# Patient Record
Sex: Female | Born: 1965 | Race: White | Hispanic: No | Marital: Single | Smoking: Former smoker
Health system: Southern US, Community
[De-identification: ages and names within clinical notes are randomized; demographics above are authoritative.]

## PROBLEM LIST (undated history)

## (undated) DIAGNOSIS — Z87898 Personal history of other specified conditions: Secondary | ICD-10-CM

## (undated) DIAGNOSIS — M1990 Unspecified osteoarthritis, unspecified site: Secondary | ICD-10-CM

## (undated) DIAGNOSIS — J45909 Unspecified asthma, uncomplicated: Secondary | ICD-10-CM

## (undated) DIAGNOSIS — I83893 Varicose veins of bilateral lower extremities with other complications: Secondary | ICD-10-CM

## (undated) DIAGNOSIS — K7460 Unspecified cirrhosis of liver: Secondary | ICD-10-CM

## (undated) DIAGNOSIS — F419 Anxiety disorder, unspecified: Secondary | ICD-10-CM

## (undated) DIAGNOSIS — F329 Major depressive disorder, single episode, unspecified: Secondary | ICD-10-CM

## (undated) DIAGNOSIS — K219 Gastro-esophageal reflux disease without esophagitis: Secondary | ICD-10-CM

## (undated) DIAGNOSIS — J449 Chronic obstructive pulmonary disease, unspecified: Secondary | ICD-10-CM

## (undated) DIAGNOSIS — F32A Depression, unspecified: Secondary | ICD-10-CM

## (undated) DIAGNOSIS — F1910 Other psychoactive substance abuse, uncomplicated: Secondary | ICD-10-CM

## (undated) DIAGNOSIS — R031 Nonspecific low blood-pressure reading: Secondary | ICD-10-CM

## (undated) DIAGNOSIS — M419 Scoliosis, unspecified: Secondary | ICD-10-CM

## (undated) HISTORY — DX: Other psychoactive substance abuse, uncomplicated: F19.10

## (undated) HISTORY — DX: Depression, unspecified: F32.A

## (undated) HISTORY — DX: Scoliosis, unspecified: M41.9

## (undated) HISTORY — DX: Major depressive disorder, single episode, unspecified: F32.9

## (undated) HISTORY — DX: Unspecified cirrhosis of liver: K74.60

## (undated) HISTORY — PX: TUBAL LIGATION: SHX77

## (undated) HISTORY — DX: Anxiety disorder, unspecified: F41.9

---

## 2013-07-05 ENCOUNTER — Encounter (HOSPITAL_COMMUNITY): Payer: Self-pay | Admitting: Emergency Medicine

## 2013-07-05 ENCOUNTER — Inpatient Hospital Stay (HOSPITAL_COMMUNITY)
Admission: EM | Admit: 2013-07-05 | Discharge: 2013-07-08 | DRG: 641 | Disposition: A | Discharge status: Home / Self Care | Payer: Self-pay | Attending: Internal Medicine | Admitting: Internal Medicine

## 2013-07-05 DIAGNOSIS — L409 Psoriasis, unspecified: Secondary | ICD-10-CM

## 2013-07-05 DIAGNOSIS — K5289 Other specified noninfective gastroenteritis and colitis: Secondary | ICD-10-CM | POA: Diagnosis present

## 2013-07-05 DIAGNOSIS — R7989 Other specified abnormal findings of blood chemistry: Secondary | ICD-10-CM

## 2013-07-05 DIAGNOSIS — R748 Abnormal levels of other serum enzymes: Secondary | ICD-10-CM

## 2013-07-05 DIAGNOSIS — A779 Spotted fever, unspecified: Secondary | ICD-10-CM | POA: Diagnosis present

## 2013-07-05 DIAGNOSIS — K529 Noninfective gastroenteritis and colitis, unspecified: Secondary | ICD-10-CM

## 2013-07-05 DIAGNOSIS — R233 Spontaneous ecchymoses: Secondary | ICD-10-CM

## 2013-07-05 DIAGNOSIS — R740 Nonspecific elevation of levels of transaminase and lactic acid dehydrogenase [LDH]: Secondary | ICD-10-CM

## 2013-07-05 DIAGNOSIS — R945 Abnormal results of liver function studies: Secondary | ICD-10-CM

## 2013-07-05 DIAGNOSIS — R7402 Elevation of levels of lactic acid dehydrogenase (LDH): Secondary | ICD-10-CM | POA: Diagnosis present

## 2013-07-05 DIAGNOSIS — R7401 Elevation of levels of liver transaminase levels: Secondary | ICD-10-CM | POA: Diagnosis present

## 2013-07-05 DIAGNOSIS — F17200 Nicotine dependence, unspecified, uncomplicated: Secondary | ICD-10-CM | POA: Diagnosis present

## 2013-07-05 DIAGNOSIS — E871 Hypo-osmolality and hyponatremia: Principal | ICD-10-CM | POA: Diagnosis present

## 2013-07-05 DIAGNOSIS — R21 Rash and other nonspecific skin eruption: Secondary | ICD-10-CM | POA: Diagnosis present

## 2013-07-05 LAB — COMPREHENSIVE METABOLIC PANEL
ALBUMIN: 3.7 g/dL (ref 3.5–5.2)
ALK PHOS: 118 U/L — AB (ref 39–117)
ALT: 135 U/L — ABNORMAL HIGH (ref 0–35)
AST: 146 U/L — ABNORMAL HIGH (ref 0–37)
BUN: 11 mg/dL (ref 6–23)
CHLORIDE: 81 meq/L — AB (ref 96–112)
CO2: 22 mEq/L (ref 19–32)
Calcium: 9.4 mg/dL (ref 8.4–10.5)
Creatinine, Ser: 0.96 mg/dL (ref 0.50–1.10)
GFR calc Af Amer: 80 mL/min — ABNORMAL LOW (ref >90)
GFR calc non Af Amer: 69 mL/min — ABNORMAL LOW (ref >90)
Glucose, Bld: 121 mg/dL — ABNORMAL HIGH (ref 70–99)
Potassium: 4.5 mEq/L (ref 3.7–5.3)
Sodium: 117 mEq/L — CL (ref 137–147)
Total Bilirubin: 0.8 mg/dL (ref 0.3–1.2)
Total Protein: 7.4 g/dL (ref 6.0–8.3)

## 2013-07-05 LAB — URINALYSIS, ROUTINE W REFLEX MICROSCOPIC
Bilirubin Urine: NEGATIVE
GLUCOSE, UA: NEGATIVE mg/dL
Hgb urine dipstick: NEGATIVE
KETONES UR: NEGATIVE mg/dL
Nitrite: NEGATIVE
PROTEIN: NEGATIVE mg/dL
Specific Gravity, Urine: 1.005 (ref 1.005–1.030)
UROBILINOGEN UA: 1 mg/dL (ref 0.0–1.0)
pH: 6 (ref 5.0–8.0)

## 2013-07-05 LAB — CBC WITH DIFFERENTIAL/PLATELET
BASOS PCT: 1 % (ref 0–1)
Basophils Absolute: 0 10*3/uL (ref 0.0–0.1)
Eosinophils Absolute: 0.1 10*3/uL (ref 0.0–0.7)
Eosinophils Relative: 1 % (ref 0–5)
HCT: 37 % (ref 36.0–46.0)
HEMOGLOBIN: 13.5 g/dL (ref 12.0–15.0)
Lymphocytes Relative: 10 % — ABNORMAL LOW (ref 12–46)
Lymphs Abs: 0.6 10*3/uL — ABNORMAL LOW (ref 0.7–4.0)
MCH: 34.5 pg — ABNORMAL HIGH (ref 26.0–34.0)
MCHC: 36.5 g/dL — ABNORMAL HIGH (ref 30.0–36.0)
MCV: 94.6 fL (ref 78.0–100.0)
MONOS PCT: 9 % (ref 3–12)
Monocytes Absolute: 0.5 10*3/uL (ref 0.1–1.0)
NEUTROS ABS: 4.3 10*3/uL (ref 1.7–7.7)
NEUTROS PCT: 79 % — AB (ref 43–77)
Platelets: 140 10*3/uL — ABNORMAL LOW (ref 150–400)
RBC: 3.91 MIL/uL (ref 3.87–5.11)
RDW: 11.3 % — ABNORMAL LOW (ref 11.5–15.5)
WBC: 5.5 10*3/uL (ref 4.0–10.5)

## 2013-07-05 LAB — URINE MICROSCOPIC-ADD ON

## 2013-07-05 LAB — LIPASE, BLOOD: Lipase: 43 U/L (ref 11–59)

## 2013-07-05 LAB — PROTIME-INR
INR: 0.97 (ref 0.00–1.49)
PROTHROMBIN TIME: 12.7 s (ref 11.6–15.2)

## 2013-07-05 MED ORDER — FOLIC ACID 5 MG/ML IJ SOLN
1.0000 mg | Freq: Every day | INTRAMUSCULAR | Status: DC
  Administered 2013-07-05 – 2013-07-07 (×3): 1 mg via INTRAVENOUS
  Filled 2013-07-05 (×6): qty 0.2

## 2013-07-05 MED ORDER — ONDANSETRON HCL 4 MG/2ML IJ SOLN
4.0000 mg | Freq: Once | INTRAMUSCULAR | Status: DC
  Filled 2013-07-05: qty 2

## 2013-07-05 MED ORDER — MORPHINE SULFATE 2 MG/ML IJ SOLN: 1.0000 mg | INTRAMUSCULAR | Status: DC | PRN

## 2013-07-05 MED ORDER — DOXYCYCLINE HYCLATE 100 MG IV SOLR
100.0000 mg | Freq: Once | INTRAVENOUS | Status: AC
  Administered 2013-07-05: 100 mg via INTRAVENOUS
  Filled 2013-07-05 (×2): qty 100

## 2013-07-05 MED ORDER — SODIUM CHLORIDE 0.9 % IV SOLN
INTRAVENOUS | Status: DC
  Administered 2013-07-05: 18:00:00 via INTRAVENOUS

## 2013-07-05 MED ORDER — HYDROCODONE-ACETAMINOPHEN 5-325 MG PO TABS: 1.0000 | ORAL_TABLET | ORAL | Status: DC | PRN

## 2013-07-05 MED ORDER — ONDANSETRON HCL 4 MG/2ML IJ SOLN: 4.0000 mg | Freq: Four times a day (QID) | INTRAMUSCULAR | Status: DC | PRN

## 2013-07-05 MED ORDER — NICOTINE 14 MG/24HR TD PT24
14.0000 mg | MEDICATED_PATCH | Freq: Every day | TRANSDERMAL | Status: DC
  Administered 2013-07-05 – 2013-07-08 (×4): 14 mg via TRANSDERMAL
  Filled 2013-07-05 (×4): qty 1

## 2013-07-05 MED ORDER — PANTOPRAZOLE SODIUM 40 MG IV SOLR
40.0000 mg | Freq: Two times a day (BID) | INTRAVENOUS | Status: DC
  Administered 2013-07-05 – 2013-07-07 (×4): 40 mg via INTRAVENOUS
  Filled 2013-07-05 (×5): qty 40

## 2013-07-05 MED ORDER — ACETAMINOPHEN 325 MG PO TABS
650.0000 mg | ORAL_TABLET | Freq: Four times a day (QID) | ORAL | Status: DC | PRN
  Administered 2013-07-05 – 2013-07-07 (×4): 650 mg via ORAL
  Filled 2013-07-05 (×4): qty 2

## 2013-07-05 MED ORDER — THIAMINE HCL 100 MG/ML IJ SOLN
100.0000 mg | Freq: Every day | INTRAMUSCULAR | Status: DC
  Administered 2013-07-05 – 2013-07-07 (×3): 100 mg via INTRAVENOUS
  Filled 2013-07-05 (×3): qty 1

## 2013-07-05 MED ORDER — DOXYCYCLINE HYCLATE 100 MG IV SOLR
100.0000 mg | Freq: Two times a day (BID) | INTRAVENOUS | Status: DC
  Administered 2013-07-06 (×2): 100 mg via INTRAVENOUS
  Filled 2013-07-05 (×3): qty 100

## 2013-07-05 MED ORDER — ENOXAPARIN SODIUM 40 MG/0.4ML ~~LOC~~ SOLN
40.0000 mg | SUBCUTANEOUS | Status: DC
  Administered 2013-07-05 – 2013-07-07 (×3): 40 mg via SUBCUTANEOUS
  Filled 2013-07-05 (×4): qty 0.4

## 2013-07-05 MED ORDER — ONDANSETRON HCL 4 MG PO TABS: 4.0000 mg | ORAL_TABLET | Freq: Four times a day (QID) | ORAL | Status: DC | PRN

## 2013-07-05 MED ORDER — SODIUM CHLORIDE 0.9 % IV SOLN: INTRAVENOUS | Status: DC

## 2013-07-05 NOTE — H&P (Signed)
[facility] History and Physical  Janet Tyler NOB:[id number] DOB: [birth year] DOA: 07/05/2013  Referring physician: ED physician.  PCP: No primary provider on file.   Chief Complaint: Diarrhea, Nausea and vomiting.   HPI: Janet Tyler is a 48 y.o. female with no significant PMH who presents to ED complaining of generalized weakness, nausea, vomiting and diarrhea for last week. No significant fevers, no headaches. She also relates mild abdominal pain worse after she vomits. She drinks alcohol 5 beers per week, she is current smoker, no PMH. No recent travel, no sick contact, no recent intake of antibiotics. Denies melena, hematochezia.    Review of Systems:  Negative except as per HPI.   No past medical history. No past surgical history.  Social History:  reports that she has been smoking Cigarettes.  She has been smoking about Half a  packs per day. She has never used smokeless tobacco. She reports that she does  drink alcohol, 5 beers per week. Her drug history is not on file.  No Known Allergies  Family history: Mother history of HTN.   Prior to Admission medications   Medication Sig Start Date End Date Taking? Authorizing Provider  acetaminophen (TYLENOL) 325 MG tablet Take 650 mg by mouth every 6 (six) hours as needed (pain).   Yes Historical Provider, MD  vitamin B-12 (CYANOCOBALAMIN) 100 MCG tablet Take 100 mcg by mouth daily.   Yes Historical Provider, MD   Physical Exam: Filed Vitals:   07/05/13 1544  BP: 116/78  Pulse: 79  Temp: 99 F (37.2 C)  Resp: 16    BP 116/78  Pulse 79  Temp(Src) 99 F (37.2 C) (Oral)  Resp 16  SpO2 98%  General:  Appears calm and comfortable Eyes: PERRL, normal lids, irises & conjunctiva ENT: grossly normal hearing, lips & tongue Neck: no LAD, masses or thyromegaly Cardiovascular: RRR, no m/r/g.  Respiratory: CTA bilaterally, no w/r/r. Normal respiratory effort. Abdomen: soft, mild generalized tenderness, no rigidity.    Skin: rash, diffuse, more pronounce on extremities, blanchable, and Lower extremities is more petechia rash.  Musculoskeletal: grossly normal tone BUE/BLE Neurologic: grossly non-focal. She is alert, appropriately answering questions.           Labs on Admission:  Basic Metabolic Panel:  Recent Labs Lab 07/05/13 1257  NA 117*  K 4.5  CL 81*  CO2 22  GLUCOSE 121*  BUN 11  CREATININE 0.96  CALCIUM 9.4   Liver Function Tests:  Recent Labs Lab 07/05/13 1257  AST 146*  ALT 135*  ALKPHOS 118*  BILITOT 0.8  PROT 7.4  ALBUMIN 3.7    Recent Labs Lab 07/05/13 1257  LIPASE 43   No results found for this basename: AMMONIA,  in the last 168 hours CBC:  Recent Labs Lab 07/05/13 1257  WBC 5.5  NEUTROABS 4.3  HGB 13.5  HCT 37.0  MCV 94.6  PLT 140*   Cardiac Enzymes: No results found for this basename: CKTOTAL, CKMB, CKMBINDEX, TROPONINI,  in the last 168 hours  BNP (last 3 results) No results found for this basename: PROBNP,  in the last 8760 hours CBG: No results found for this basename: GLUCAP,  in the last 168 hours  Radiological Exams on Admission: No results found.  EKG:   Assessment/Plan Active Problems:   Hyponatremia   Abnormal transaminases   Rash   Gastroenteritis  1-Hyponatremia; could be multifactorial, secondary to decrease volume from diarrhea, vomiting. Also patient with history of alcohol use. Will check urine  osmolality. She received 1 L IV fluids in The ED. Will continue with 75 cc NS. Repeat B-met every 6 hours. Will try not to correct sodium more than 8 Meq in 24 hours periods.. Will need to adjust Fluids as needed.   2-Rash: Concern for RMSF, patient with GI symptoms, weakness> Monitor for fever. Agree with starting Doxycycline. RMSF serology ordered. Platelet not significantly low, but might contribute to rash.   3-Gastroenteritis; Patient with Diarrhea, N, V. Relates some abdominal pain. Will check GI pathogen. No recent antibiotics  intake. This might be related to RMSF. Will start protonix for ? Gastritis.   4-Alcohol intake; Will start Thiamine and folate. Will monitor on CIWA protocol.   5-Transaminases; Could be related to alcohol intake. Agree with Viral Hepatitis Panel. If hepatitis panel positive could consider ordering cryoglobulin.   Code Status: Full Code.  Family Communication: Care discussed with patient.  Disposition Plan: expect 3 to 4 days.   Time spent: 75 minutes.   Niel Hummer A [facility] Pager [phone]  **Disclaimer: This note may have been dictated with voice recognition software. Similar sounding words can inadvertently be transcribed and this note may contain transcription errors which may not have been corrected upon publication of note.**

## 2013-07-05 NOTE — ED Provider Notes (Signed)
CSN: [id number]     Arrival date & time 07/05/13  1152 History   First MD Initiated Contact with Patient 07/05/13 1453     Chief Complaint  Patient presents with  . Emesis  . Diarrhea  . Rash     (Consider location/radiation/quality/duration/timing/severity/associated sxs/prior Treatment) Patient is a 48 y.o. female presenting with vomiting, diarrhea, and rash. The history is provided by the patient. No language interpreter was used.  Emesis Severity:  Severe Duration:  1 day Timing:  Constant Quality:  Stomach contents Feeding tolerance: None. Progression:  Unchanged Associated symptoms: abdominal pain ("soreness" from vomiting) and diarrhea   Associated symptoms: no arthralgias, no chills, no cough, no fever, no headaches and no sore throat   Abdominal pain:    Quality:  Dull and aching   Severity:  Mild   Abdominal pain frequency: with vomiting.   Progression:  Unchanged   Chronicity:  New Diarrhea:    Quality:  Watery   Number of occurrences:  3-4 days   Severity:  Moderate   Diarrhea duration: 3-4 days.   Timing:  Intermittent   Progression:  Unchanged Risk factors: no alcohol use, no diabetes, not pregnant now, no prior abdominal surgery, no sick contacts, no suspect food intake and no travel to endemic areas   Diarrhea Associated symptoms: abdominal pain ("soreness" from vomiting) and vomiting   Associated symptoms: no arthralgias, no chills, no recent cough, no diaphoresis, no fever and no headaches   Rash Location:  Full body (migratory) Quality: itchiness and redness   Severity:  Moderate Onset quality:  Unable to specify Duration: 2-3 days. Timing:  Intermittent Progression:  Waxing and waning Chronicity:  New Relieved by:  Nothing Worsened by:  Nothing tried Ineffective treatments:  None tried Associated symptoms: abdominal pain ("soreness" from vomiting), diarrhea and vomiting   Associated symptoms: no fatigue, no fever, no headaches, no joint pain, no  nausea, no shortness of breath and no sore throat     History reviewed. No pertinent past medical history. History reviewed. No pertinent past surgical history. No family history on file. History  Substance Use Topics  . Smoking status: Current Every Day Smoker    Types: Cigarettes  . Smokeless tobacco: Never Used  . Alcohol Use: No   OB History   Grav Para Term Preterm Abortions TAB SAB Ect Mult Living                 Review of Systems  Constitutional: Negative for fever, chills, diaphoresis, activity change, appetite change and fatigue.  HENT: Negative for congestion, facial swelling, rhinorrhea and sore throat.   Eyes: Negative for photophobia and discharge.  Respiratory: Negative for cough, chest tightness and shortness of breath.   Cardiovascular: Negative for chest pain, palpitations and leg swelling.  Gastrointestinal: Positive for vomiting, abdominal pain ("soreness" from vomiting) and diarrhea. Negative for nausea.  Endocrine: Negative for polydipsia and polyuria.  Genitourinary: Negative for dysuria, frequency, difficulty urinating and pelvic pain.  Musculoskeletal: Negative for arthralgias, back pain, neck pain and neck stiffness.  Skin: Positive for rash. Negative for color change and wound.  Allergic/Immunologic: Negative for immunocompromised state.  Neurological: Negative for facial asymmetry, weakness, numbness and headaches.  Hematological: Does not bruise/bleed easily.  Psychiatric/Behavioral: Negative for confusion and agitation.      Allergies  Review of patient's allergies indicates no known allergies.  Home Medications   Prior to Admission medications   Medication Sig Start Date End Date Taking? Authorizing Provider  acetaminophen (TYLENOL)  325 MG tablet Take 650 mg by mouth every 6 (six) hours as needed (pain).   Yes Historical Provider, MD  vitamin B-12 (CYANOCOBALAMIN) 100 MCG tablet Take 100 mcg by mouth daily.   Yes Historical Provider, MD    BP 129/89  Pulse 75  Temp(Src) 99.1 F (37.3 C) (Oral)  Resp 18  Ht 5\' 7"  (1.702 m)  Wt 170 lb 11.2 oz (77.429 kg)  BMI 26.73 kg/m2  SpO2 97% Physical Exam  Constitutional: She is oriented to person, place, and time. She appears well-developed and well-nourished. No distress.  HENT:  Head: Normocephalic and atraumatic.  Mouth/Throat: No oropharyngeal exudate.  Eyes: Pupils are equal, round, and reactive to light.  Neck: Normal range of motion. Neck supple.  Cardiovascular: Normal rate, regular rhythm and normal heart sounds.  Exam reveals no gallop and no friction rub.   No murmur heard. Pulmonary/Chest: Effort normal and breath sounds normal. No respiratory distress. She has no wheezes. She has no rales.  Abdominal: Soft. Bowel sounds are normal. She exhibits no distension and no mass. There is no tenderness. There is no rebound and no guarding.  Musculoskeletal: Normal range of motion. She exhibits no edema and no tenderness.  Neurological: She is alert and oriented to person, place, and time.  Skin: Skin is warm and dry. Rash (blanchable reticular rash on upper extremities, nonblanching petechial rash on BLLE) noted.  Psychiatric: She has a normal mood and affect.    ED Course  Procedures (including critical care time) Labs Review Labs Reviewed  CBC WITH DIFFERENTIAL - Abnormal; Notable for the following:    MCH 34.5 (*)    MCHC 36.5 (*)    RDW 11.3 (*)    Platelets 140 (*)    Neutrophils Relative % 79 (*)    Lymphocytes Relative 10 (*)    Lymphs Abs 0.6 (*)    All other components within normal limits  COMPREHENSIVE METABOLIC PANEL - Abnormal; Notable for the following:    Sodium 117 (*)    Chloride 81 (*)    Glucose, Bld 121 (*)    AST 146 (*)    ALT 135 (*)    Alkaline Phosphatase 118 (*)    GFR calc non Af Amer 69 (*)    GFR calc Af Amer 80 (*)    All other components within normal limits  LIPASE, BLOOD  PROTIME-INR  HEPATITIS PANEL, ACUTE  URINALYSIS,  ROUTINE W REFLEX MICROSCOPIC  ROCKY MTN SPOTTED FVR AB, IGG-BLOOD  ROCKY MTN SPOTTED FVR AB, IGM-BLOOD  OSMOLALITY  OSMOLALITY, URINE  BASIC METABOLIC PANEL  BASIC METABOLIC PANEL  GI PATHOGEN PANEL BY PCR, STOOL  CBC    Imaging Review No results found.   EKG Interpretation None      MDM   Final diagnoses:  Hyponatremia  Elevated LFTs  Petechial rash    Pt is a 48 y.o. female with Pmhx as above who presents with 1 wk of n/v fatigue, 3 days d/a, 4 days of intermittent migratory rash.  Denies tick exposure. Pt initially denied heavy ETOH, daughter reports she is an alcoholic and drinks beer everyday.   On PE, VSS, pt in NAD. Cariopulm & abdominal exam benign. She has blanching reticular rash on UE, and non blanching petechial rash on BLLE.  W/U includes Na 117, mild LFT elevations. INR nml. Have ordered RMSF titers, hepatitis panel. As I have some concern for RSMF have ordered doxycyline. Doubt meningitis given no h/a, fever. DDx for Na include  beer potomania, or RMSF. Triage consulted, will admit for further tx of Na and w/u.        Shanna CiscoMegan E Docherty, MD 07/05/13 2008

## 2013-07-05 NOTE — ED Notes (Signed)
Pt presents to ed with c/o generalized abdominal pain, nausea, vomiting x 1 week. Pt also reports one episode of diarrhea this morning. When asked during the assessment, pt denies any alcohol use, however, later she asks "if someone drinks occasionally can that cause them to feel sick like this". Then she admits to drinking "one glass of wine every week or so". After pt went to bathroom her daughter started crying and reports pt is an alcoholic, who drinks "beer, lots of it every day". Pt also reports skin rash on her arms and legs. Redness to bilateral arms noted as well as petechia like rash on her bilat.legs

## 2013-07-05 NOTE — ED Notes (Signed)
Critical lab Sodium 117 

## 2013-07-05 NOTE — ED Notes (Signed)
Pt c/o n/v/d for several days, unable to keep any foods or liquids down. Pt states that she feels really weak and has no energy.  Pt also c.o rash all over body that comes and goes over the past 4 days. Pt c/o itching on her arms.

## 2013-07-06 LAB — HEPATIC FUNCTION PANEL
ALBUMIN: 2.9 g/dL — AB (ref 3.5–5.2)
ALK PHOS: 103 U/L (ref 39–117)
ALT: 88 U/L — AB (ref 0–35)
AST: 75 U/L — AB (ref 0–37)
BILIRUBIN TOTAL: 0.5 mg/dL (ref 0.3–1.2)
Bilirubin, Direct: 0.2 mg/dL (ref 0.0–0.3)
Indirect Bilirubin: 0.3 mg/dL (ref 0.3–0.9)
TOTAL PROTEIN: 6.1 g/dL (ref 6.0–8.3)

## 2013-07-06 LAB — BASIC METABOLIC PANEL
BUN: 13 mg/dL (ref 6–23)
BUN: 14 mg/dL (ref 6–23)
BUN: 12 mg/dL (ref 6–23)
BUN: 13 mg/dL (ref 6–23)
BUN: 13 mg/dL (ref 6–23)
CALCIUM: 8.4 mg/dL (ref 8.4–10.5)
CALCIUM: 8.6 mg/dL (ref 8.4–10.5)
CHLORIDE: 89 meq/L — AB (ref 96–112)
CHLORIDE: 90 meq/L — AB (ref 96–112)
CO2: 17 meq/L — AB (ref 19–32)
CO2: 18 mEq/L — ABNORMAL LOW (ref 19–32)
CO2: 18 meq/L — AB (ref 19–32)
CO2: 21 mEq/L (ref 19–32)
CO2: 22 meq/L (ref 19–32)
CREATININE: 0.82 mg/dL (ref 0.50–1.10)
CREATININE: 0.88 mg/dL (ref 0.50–1.10)
CREATININE: 0.96 mg/dL (ref 0.50–1.10)
Calcium: 8.3 mg/dL — ABNORMAL LOW (ref 8.4–10.5)
Calcium: 8.6 mg/dL (ref 8.4–10.5)
Calcium: 8.6 mg/dL (ref 8.4–10.5)
Chloride: 87 mEq/L — ABNORMAL LOW (ref 96–112)
Chloride: 88 mEq/L — ABNORMAL LOW (ref 96–112)
Chloride: 89 mEq/L — ABNORMAL LOW (ref 96–112)
Creatinine, Ser: 0.97 mg/dL (ref 0.50–1.10)
Creatinine, Ser: 0.96 mg/dL (ref 0.50–1.10)
GFR calc Af Amer: 89 mL/min — ABNORMAL LOW (ref >90)
GFR calc Af Amer: 79 mL/min — ABNORMAL LOW (ref >90)
GFR calc Af Amer: >90 mL/min (ref >90)
GFR calc Af Amer: 80 mL/min — ABNORMAL LOW (ref >90)
GFR calc non Af Amer: 68 mL/min — ABNORMAL LOW (ref >90)
GFR calc non Af Amer: 69 mL/min — ABNORMAL LOW (ref >90)
GFR calc non Af Amer: 69 mL/min — ABNORMAL LOW (ref >90)
GFR calc non Af Amer: 77 mL/min — ABNORMAL LOW (ref >90)
GFR calc non Af Amer: 84 mL/min — ABNORMAL LOW (ref >90)
GFR, EST AFRICAN AMERICAN: 80 mL/min — AB (ref >90)
GLUCOSE: 108 mg/dL — AB (ref 70–99)
GLUCOSE: 114 mg/dL — AB (ref 70–99)
GLUCOSE: 126 mg/dL — AB (ref 70–99)
Glucose, Bld: 114 mg/dL — ABNORMAL HIGH (ref 70–99)
Glucose, Bld: 120 mg/dL — ABNORMAL HIGH (ref 70–99)
POTASSIUM: 3.6 meq/L — AB (ref 3.7–5.3)
Potassium: 4.4 mEq/L (ref 3.7–5.3)
Potassium: 4.3 mEq/L (ref 3.7–5.3)
Potassium: 3.9 mEq/L (ref 3.7–5.3)
Potassium: 3.9 mEq/L (ref 3.7–5.3)
SODIUM: 123 meq/L — AB (ref 137–147)
Sodium: 117 mEq/L — CL (ref 137–147)
Sodium: 120 mEq/L — CL (ref 137–147)
Sodium: 122 mEq/L — ABNORMAL LOW (ref 137–147)
Sodium: 122 mEq/L — ABNORMAL LOW (ref 137–147)

## 2013-07-06 LAB — CBC
HEMATOCRIT: 35.8 % — AB (ref 36.0–46.0)
Hemoglobin: 13 g/dL (ref 12.0–15.0)
MCH: 34.2 pg — AB (ref 26.0–34.0)
MCHC: 36.3 g/dL — ABNORMAL HIGH (ref 30.0–36.0)
MCV: 94.2 fL (ref 78.0–100.0)
Platelets: 124 10*3/uL — ABNORMAL LOW (ref 150–400)
RBC: 3.8 MIL/uL — ABNORMAL LOW (ref 3.87–5.11)
RDW: 11.5 % (ref 11.5–15.5)
WBC: 4.8 10*3/uL (ref 4.0–10.5)

## 2013-07-06 LAB — HEPATITIS PANEL, ACUTE
HCV Ab: NEGATIVE
HEP A IGM: NONREACTIVE
HEP B C IGM: NONREACTIVE
Hepatitis B Surface Ag: NEGATIVE

## 2013-07-06 LAB — OSMOLALITY: Osmolality: 248 mOsm/kg — CL (ref 275–300)

## 2013-07-06 LAB — OSMOLALITY, URINE: OSMOLALITY UR: 140 mosm/kg — AB (ref 390–1090)

## 2013-07-06 MED ORDER — DOXYCYCLINE HYCLATE 100 MG PO TABS
100.0000 mg | ORAL_TABLET | Freq: Two times a day (BID) | ORAL | Status: DC
  Administered 2013-07-06 – 2013-07-08 (×4): 100 mg via ORAL
  Filled 2013-07-06 (×5): qty 1

## 2013-07-06 MED ORDER — LORAZEPAM 0.5 MG PO TABS
0.5000 mg | ORAL_TABLET | Freq: Once | ORAL | Status: AC
  Administered 2013-07-06: 0.5 mg via ORAL
  Filled 2013-07-06: qty 1

## 2013-07-06 NOTE — Progress Notes (Signed)
[facility] PROGRESS NOTE  Janet IvoryDeana Tyler JXB:[id number] DOB: 1966/01/17 DOA: 07/05/2013 PCP: No primary provider on file.  Assessment/Plan: 1-Hyponatremia; could be multifactorial, secondary to decrease volume from diarrhea, vomiting. Also patient with history of alcohol use. Will try not to correct sodium more than 8 Meq in 24 hours periods. Sodium at 123. Continue with IV fluids 75 cc/Hr  2-Rash: Concern for RMSF, patient with GI symptoms, weakness> Monitor for fever. Continue with Doxycycline. RMSF serology ordered. Platelet not significantly low, but might contribute to rash.   3-Gastroenteritis; Patient with Diarrhea, N, V. Relates some abdominal pain. GI pathogen ordered.  No recent antibiotics intake. This might be related to RMSF. Improved.   4-Alcohol intake; Thiamine and folate. Will monitor on CIWA protocol.   5-Transaminases; Could be related to alcohol intake.  Viral Hepatitis Panel negative.  LFT trending down.    Code Status: full code.  Family Communication: Care discussed with patient.  Disposition Plan: remain inpatient.    Consultants:  none  Procedures:  none  Antibiotics:  Doxycycline.   HPI/Subjective: Feeling better, no abdominal pain, vomiting. Tolerating diet.   Objective: Filed Vitals:   07/06/13 0800  BP: 132/86  Pulse:   Temp:   Resp:     Intake/Output Summary (Last 24 hours) at 07/06/13 1334 Last data filed at 07/06/13 0800  Gross per 24 hour  Intake  948.1 ml  Output      0 ml  Net  948.1 ml   Filed Weights   07/05/13 1808  Weight: 77.429 kg (170 lb 11.2 oz)    Exam:   General: No distress.   Cardiovascular: S 1, S 2 RRR  Respiratory: CTA  Abdomen: BS present, soft, NT  Musculoskeletal: no edema  Skin; petechia rash LE and blanchable rash upper extremities stable.   Data Reviewed: Basic Metabolic Panel:  Recent Labs Lab 07/05/13 1257 07/05/13 2345 07/06/13 0630 07/06/13 1205  NA 117* 122* 122* 123*   K 4.5 3.6* 4.4 4.3  CL 81* 88* 89* 90*  CO2 22 21 18* 22  GLUCOSE 121* 114* 108* 114*  BUN 11 13 13 14   CREATININE 0.96 0.96 0.88 0.97  CALCIUM 9.4 8.3* 8.6 8.6   Liver Function Tests:  Recent Labs Lab 07/05/13 1257 07/06/13 1205  AST 146* 75*  ALT 135* 88*  ALKPHOS 118* 103  BILITOT 0.8 0.5  PROT 7.4 6.1  ALBUMIN 3.7 2.9*    Recent Labs Lab 07/05/13 1257  LIPASE 43   No results found for this basename: AMMONIA,  in the last 168 hours CBC:  Recent Labs Lab 07/05/13 1257 07/06/13 0630  WBC 5.5 4.8  NEUTROABS 4.3  --   HGB 13.5 13.0  HCT 37.0 35.8*  MCV 94.6 94.2  PLT 140* 124*   Cardiac Enzymes: No results found for this basename: CKTOTAL, CKMB, CKMBINDEX, TROPONINI,  in the last 168 hours BNP (last 3 results) No results found for this basename: PROBNP,  in the last 8760 hours CBG: No results found for this basename: GLUCAP,  in the last 168 hours  No results found for this or any previous visit (from the past 240 hour(s)).   Studies: No results found.  Scheduled Meds: . doxycycline (VIBRAMYCIN) IV  100 mg Intravenous Q12H  . enoxaparin (LOVENOX) injection  40 mg Subcutaneous Q24H  . folic acid  1 mg Intravenous Daily  . nicotine  14 mg Transdermal Daily  . ondansetron (ZOFRAN) IV  4 mg Intravenous Once  . pantoprazole (PROTONIX)  IV  40 mg Intravenous Q12H  . thiamine IV  100 mg Intravenous Daily   Continuous Infusions: . sodium chloride 100 mL/hr at 07/06/13 [id number]    Active Problems:   Hyponatremia   Abnormal transaminases   Rash   Gastroenteritis    Time spent: 30    Chaniya Genter A  [facility] Pager [phone]. If 7PM-7AM, please contact night-coverage at [email or web address], password [email or web address] 07/06/2013, 1:34 PM  LOS: 1 day

## 2013-07-06 NOTE — Progress Notes (Signed)
Pt with no nausea, vomiting or other GI issues. Requesting a regular diet. MD made aware. New order received for diet. Pt made aware and is happy. Vwilliams,rn.

## 2013-07-07 DIAGNOSIS — R7989 Other specified abnormal findings of blood chemistry: Secondary | ICD-10-CM

## 2013-07-07 LAB — BASIC METABOLIC PANEL
BUN: 11 mg/dL (ref 6–23)
CALCIUM: 8.7 mg/dL (ref 8.4–10.5)
CO2: 19 meq/L (ref 19–32)
Chloride: 92 mEq/L — ABNORMAL LOW (ref 96–112)
Creatinine, Ser: 0.82 mg/dL (ref 0.50–1.10)
GFR calc Af Amer: >90 mL/min (ref >90)
GFR calc non Af Amer: 84 mL/min — ABNORMAL LOW (ref >90)
Glucose, Bld: 93 mg/dL (ref 70–99)
Potassium: 4.1 mEq/L (ref 3.7–5.3)
Sodium: 125 mEq/L — ABNORMAL LOW (ref 137–147)

## 2013-07-07 LAB — CBC
HCT: 33.3 % — ABNORMAL LOW (ref 36.0–46.0)
Hemoglobin: 11.8 g/dL — ABNORMAL LOW (ref 12.0–15.0)
MCH: 33.9 pg (ref 26.0–34.0)
MCHC: 35.4 g/dL (ref 30.0–36.0)
MCV: 95.7 fL (ref 78.0–100.0)
Platelets: 178 10*3/uL (ref 150–400)
RBC: 3.48 MIL/uL — AB (ref 3.87–5.11)
RDW: 11.4 % — AB (ref 11.5–15.5)
WBC: 4.4 10*3/uL (ref 4.0–10.5)

## 2013-07-07 LAB — ROCKY MTN SPOTTED FVR AB, IGG-BLOOD: RMSF IGG: 0.09 IV

## 2013-07-07 LAB — ROCKY MTN SPOTTED FVR AB, IGM-BLOOD: RMSF IgM: 0.11 IV (ref 0.00–0.89)

## 2013-07-07 MED ORDER — FOLIC ACID 1 MG PO TABS
1.0000 mg | ORAL_TABLET | Freq: Every day | ORAL | Status: DC
  Administered 2013-07-07 – 2013-07-08 (×2): 1 mg via ORAL
  Filled 2013-07-07 (×2): qty 1

## 2013-07-07 MED ORDER — LORAZEPAM 2 MG/ML IJ SOLN: 1.0000 mg | Freq: Four times a day (QID) | INTRAMUSCULAR | Status: DC | PRN

## 2013-07-07 MED ORDER — LORAZEPAM 1 MG PO TABS
1.0000 mg | ORAL_TABLET | Freq: Four times a day (QID) | ORAL | Status: DC | PRN
  Administered 2013-07-07: 1 mg via ORAL
  Filled 2013-07-07: qty 1

## 2013-07-07 MED ORDER — VITAMIN B-1 100 MG PO TABS
100.0000 mg | ORAL_TABLET | Freq: Every day | ORAL | Status: DC
  Administered 2013-07-07 – 2013-07-08 (×2): 100 mg via ORAL
  Filled 2013-07-07 (×2): qty 1

## 2013-07-07 MED ORDER — PANTOPRAZOLE SODIUM 40 MG PO TBEC
40.0000 mg | DELAYED_RELEASE_TABLET | Freq: Every day | ORAL | Status: DC
  Administered 2013-07-07 – 2013-07-08 (×2): 40 mg via ORAL
  Filled 2013-07-07 (×2): qty 1

## 2013-07-07 NOTE — Progress Notes (Signed)
[facility] PROGRESS NOTE  Charlesetta IvoryDeana Ahlers RUE:[id number] DOB: 06-28-1965 DOA: 07/05/2013 PCP: No primary provider on file.  Assessment/Plan: 1-Hyponatremia; could be multifactorial, secondary to decrease volume from diarrhea, vomiting. Also patient with history of alcohol use. Will try not to correct sodium more than 8 Meq in 24 hours periods. Sodium started to decrease on IV fluids. After NSL fluids and fluids restriction sodium has increase to 125. Continue with water restriction. Repeat sodium in am.   2-Rash: Concern for RMSF, patient with GI symptoms, weakness> Monitor for fever. Continue with Doxycycline. RMSF serology ordered. Platelet not significantly low, but might contribute to rash. Rash improving.   3-Gastroenteritis; Patient with Diarrhea, N, V.  Resolved. This might be related to RMSF.  4-Alcohol intake; Thiamine and folate. CIWA, start ativan PRN> she is determine to stopping drinking alcohol.   5-Transaminases; Could be related to alcohol intake.  Viral Hepatitis Panel negative.  LFT trending down.    Code Status: full code.  Family Communication: Care discussed with patient.  Disposition Plan: remain inpatient.    Consultants:  none  Procedures:  none  Antibiotics:  Doxycycline.   HPI/Subjective: Feels better, no diarrhea, nausea or vomiting. Rash better. Still weak. Needs something for anxiety.   Objective: Filed Vitals:   07/07/13 1439  BP: 138/87  Pulse: 77  Temp: 98.3 F (36.8 C)  Resp: 16    Intake/Output Summary (Last 24 hours) at 07/07/13 1526 Last data filed at 07/07/13 1440  Gross per 24 hour  Intake 1789.17 ml  Output      4 ml  Net 1785.17 ml   Filed Weights   07/05/13 1808  Weight: 77.429 kg (170 lb 11.2 oz)    Exam:   General: No distress.   Cardiovascular: S 1, S 2 RRR  Respiratory: CTA  Abdomen: BS present, soft, NT  Musculoskeletal: no edema  Skin; petechia rash LE and blanchable rash upper extremities  stable.   Data Reviewed: Basic Metabolic Panel:  Recent Labs Lab 07/06/13 0630 07/06/13 1205 07/06/13 1800 07/06/13 1930 07/07/13 0500  NA 122* 123* 117* 120* 125*  K 4.4 4.3 3.9 3.9 4.1  CL 89* 90* 87* 89* 92*  CO2 18* 22 17* 18* 19  GLUCOSE 108* 114* 120* 126* 93  BUN 13 14 12 13 11   CREATININE 0.88 0.97 0.82 0.96 0.82  CALCIUM 8.6 8.6 8.4 8.6 8.7   Liver Function Tests:  Recent Labs Lab 07/05/13 1257 07/06/13 1205  AST 146* 75*  ALT 135* 88*  ALKPHOS 118* 103  BILITOT 0.8 0.5  PROT 7.4 6.1  ALBUMIN 3.7 2.9*    Recent Labs Lab 07/05/13 1257  LIPASE 43   No results found for this basename: AMMONIA,  in the last 168 hours CBC:  Recent Labs Lab 07/05/13 1257 07/06/13 0630 07/07/13 0500  WBC 5.5 4.8 4.4  NEUTROABS 4.3  --   --   HGB 13.5 13.0 11.8*  HCT 37.0 35.8* 33.3*  MCV 94.6 94.2 95.7  PLT 140* 124* 178   Cardiac Enzymes: No results found for this basename: CKTOTAL, CKMB, CKMBINDEX, TROPONINI,  in the last 168 hours BNP (last 3 results) No results found for this basename: PROBNP,  in the last 8760 hours CBG: No results found for this basename: GLUCAP,  in the last 168 hours  No results found for this or any previous visit (from the past 240 hour(s)).   Studies: No results found.  Scheduled Meds: . doxycycline  100 mg Oral Q12H  .  enoxaparin (LOVENOX) injection  40 mg Subcutaneous Q24H  . folic acid  1 mg Intravenous Daily  . nicotine  14 mg Transdermal Daily  . ondansetron (ZOFRAN) IV  4 mg Intravenous Once  . pantoprazole (PROTONIX) IV  40 mg Intravenous Q12H  . thiamine IV  100 mg Intravenous Daily   Continuous Infusions:    Active Problems:   Hyponatremia   Abnormal transaminases   Rash   Gastroenteritis    Time spent: 30    Modene Andy A  [facility] Pager [phone]. If 7PM-7AM, please contact night-coverage at [email or web address], password [email or web address] 07/07/2013, 3:26 PM  LOS: 2 days

## 2013-07-08 ENCOUNTER — Encounter (HOSPITAL_COMMUNITY): Payer: Self-pay | Admitting: *Deleted

## 2013-07-08 LAB — BASIC METABOLIC PANEL
BUN: 8 mg/dL (ref 6–23)
CO2: 19 mEq/L (ref 19–32)
Calcium: 8.5 mg/dL (ref 8.4–10.5)
Chloride: 97 mEq/L (ref 96–112)
Creatinine, Ser: 0.69 mg/dL (ref 0.50–1.10)
Glucose, Bld: 94 mg/dL (ref 70–99)
POTASSIUM: 4 meq/L (ref 3.7–5.3)
SODIUM: 128 meq/L — AB (ref 137–147)

## 2013-07-08 MED ORDER — DOXYCYCLINE HYCLATE 100 MG PO TABS: 100.0000 mg | ORAL_TABLET | Freq: Two times a day (BID) | ORAL | Status: DC

## 2013-07-08 MED ORDER — LORAZEPAM 1 MG PO TABS: ORAL_TABLET | ORAL | Status: DC

## 2013-07-08 MED ORDER — PANTOPRAZOLE SODIUM 40 MG PO TBEC: 40.0000 mg | DELAYED_RELEASE_TABLET | Freq: Every day | ORAL | Status: DC

## 2013-07-08 MED ORDER — FOLIC ACID 1 MG PO TABS: 1.0000 mg | ORAL_TABLET | Freq: Every day | ORAL | Status: DC

## 2013-07-08 MED ORDER — NICOTINE 21 MG/24HR TD PT24: 14.0000 mg | MEDICATED_PATCH | Freq: Every day | TRANSDERMAL | Status: DC

## 2013-07-08 NOTE — Discharge Summary (Signed)
Physician Discharge Summary  [name]:[id number] DOB: [birth year] DOA: 07/05/2013  PCP: No primary provider on file.  Admit date: 07/05/2013 Discharge date: 07/08/2013  Time spent: 35 minutes  Recommendations for Outpatient Follow-up:  1. Needs B-met to follow renal function.  2. Needs cbc to follow up platelet level.  3. Needs LFT.  4. Need continue counseling regarding alcohol use.   Discharge Diagnoses:    Hyponatremia   Abnormal transaminases   Rash ? RMSF.    Gastroenteritis   Discharge Condition: improved.   Diet recommendation: general, 1 L water restriction.   Filed Weights   07/05/13 1808  Weight: 77.429 kg (170 lb 11.2 oz)    History of present illness:  Janet Tyler is a 48 y.o. female with no significant PMH who presents to ED complaining of generalized weakness, nausea, vomiting and diarrhea for last week. No significant fevers, no headaches. She also relates mild abdominal pain worse after she vomits. She drinks alcohol 5 beers per week, she is current smoker, no PMH. No recent travel, no sick contact, no recent intake of antibiotics. Denies melena, hematochezia    Hospital Course:  1-Hyponatremia; could be multifactorial, secondary to decrease volume from diarrhea, vomiting. Also patient with history of alcohol use. Will try not to correct sodium more than 8 Meq in 24 hours periods. Sodium started to decrease on IV fluids. After NSL fluids and fluids restriction sodium has increase to 125-- 128. Component of excessive ingestion of water. Her urine osmolality was very dilute at 140. Continue with water restriction. Need repeat B-met within 1 week.   2-Rash: Concern for RMSF, patient with GI symptoms, weakness> Monitor for fever. Continue with Doxycycline. RMSF serology ordered. Platelet not significantly low, but might contribute to rash. Rash improving. IGM for RMSF equivocal. Will prescribe a total of 10 days of antibiotics.   3-Gastroenteritis; Patient  with Diarrhea, N, V. Resolved. This might be related to RMSF.   4-Alcohol intake; Thiamine and folate. CIWA, start ativan PRN> she is determine to stopping drinking alcohol. She has not required signifiacnt amount of ativan,. Will prescribe taper dose.   5-Transaminases; Could be related to alcohol intake. Viral Hepatitis Panel negative. LFT trending down.    Procedures:  none  Consultations:  none  Discharge Exam: Filed Vitals:   07/08/13 0533  BP: 130/82  Pulse: 77  Temp: 98.3 F (36.8 C)  Resp: 18    General: No distress.  Cardiovascular: S 1, S 2 RRR Respiratory: CTA  Discharge Instructions You were cared for by a hospitalist during your hospital stay. If you have any questions about your discharge medications or the care you received while you were in the hospital after you are discharged, you can call the unit and asked to speak with the hospitalist on call if the hospitalist that took care of you is not available. Once you are discharged, your primary care physician will handle any further medical issues. Please note that NO REFILLS for any discharge medications will be authorized once you are discharged, as it is imperative that you return to your primary care physician (or establish a relationship with a primary care physician if you do not have one) for your aftercare needs so that they can reassess your need for medications and monitor your lab values.  Discharge Instructions   Diet general    Complete by:  As directed   1 L water restriction.     Increase activity slowly    Complete by:  As  directed             Medication List         acetaminophen 325 MG tablet  Commonly known as:  TYLENOL  Take 650 mg by mouth every 6 (six) hours as needed (pain).     doxycycline 100 MG tablet  Commonly known as:  VIBRA-TABS  Take 1 tablet (100 mg total) by mouth every 12 (twelve) hours.     folic acid 1 MG tablet  Commonly known as:  FOLVITE  Take 1 tablet (1 mg  total) by mouth daily.     LORazepam 1 MG tablet  Commonly known as:  ATIVAN  Take 1 tablet twice a day as needed for 3 days then 1 tablet as needed for 2 days then stopped.     nicotine 21 mg/24hr patch  Commonly known as:  NICODERM CQ - dosed in mg/24 hours  Place 1 patch (21 mg total) onto the skin daily.     pantoprazole 40 MG tablet  Commonly known as:  PROTONIX  Take 1 tablet (40 mg total) by mouth daily.     vitamin B-12 100 MCG tablet  Commonly known as:  CYANOCOBALAMIN  Take 100 mcg by mouth daily.       No Known Allergies     Follow-up Information   Please follow up. (you need lab work, B-met and CBC in 4 days. )        The results of significant diagnostics from this hospitalization (including imaging, microbiology, ancillary and laboratory) are listed below for reference.    Significant Diagnostic Studies: No results found.  Microbiology: No results found for this or any previous visit (from the past 240 hour(s)).   Labs: Basic Metabolic Panel:  Recent Labs Lab 07/06/13 1205 07/06/13 1800 07/06/13 1930 07/07/13 0500 07/08/13 0512  NA 123* 117* 120* 125* 128*  K 4.3 3.9 3.9 4.1 4.0  CL 90* 87* 89* 92* 97  CO2 22 17* 18* 19 19  GLUCOSE 114* 120* 126* 93 94  BUN _0 CREATININE 0.97 0.82 0.96 0.82 0.69  CALCIUM 8.6 8.4 8.6 8.7 8.5   Liver Function Tests:  Recent Labs Lab 07/05/13 1257 07/06/13 1205  AST 146* 75*  ALT 135* 88*  ALKPHOS 118* 103  BILITOT 0.8 0.5  PROT 7.4 6.1  ALBUMIN 3.7 2.9*    Recent Labs Lab 07/05/13 1257  LIPASE 43   No results found for this basename: AMMONIA,  in the last 168 hours CBC:  Recent Labs Lab 07/05/13 1257 07/06/13 0630 07/07/13 0500  WBC 5.5 4.8 4.4  NEUTROABS 4.3  --   --   HGB 13.5 13.0 11.8*  HCT 37.0 35.8* 33.3*  MCV 94.6 94.2 95.7  PLT 140* 124* 178   Cardiac Enzymes: No results found for this basename: CKTOTAL, CKMB, CKMBINDEX, TROPONINI,  in the last 168  hours BNP: BNP (last 3 results) No results found for this basename: PROBNP,  in the last 8760 hours CBG: No results found for this basename: GLUCAP,  in the last 168 hours     Signed:  Niel Hummer A  [facility] 07/08/2013, 1:14 PM

## 2013-07-08 NOTE — Care Management Note (Signed)
    Page 1 of 1   07/08/2013     1:49:36 PM CARE MANAGEMENT NOTE 07/08/2013  Patient:  [facility]   Account Number:  [id number]  Date Initiated:  07/07/2013  Documentation initiated by:  [facility]  Subjective/Objective Assessment:   48 year old female admitted with hyponatremia.     Action/Plan:   From home.   Anticipated DC Date:  07/08/2013   Anticipated DC Plan:  [place]  CM consult      Choice offered to / List presented to:             Status of service:  In process, will continue to follow Medicare Important Message given?   (If response is "NO", the following Medicare IM given date fields will be blank) Date Medicare IM given:   Date Additional Medicare IM given:    Discharge Disposition:    Per UR Regulation:  Reviewed for med. necessity/level of care/duration of stay  If discussed at [place] of Stay Meetings, dates discussed:    Comments:  07/08/13 Allene Dillon RN BSN [phone] Met with pt at bedside to discuss PCP followup. She does not have health insurance. I provided her with a brochure for the [facility] and advised her to walk-in after her d/c here so that she can get a follow up appt set up. Pt in agreement and appreciates assistance.

## 2013-07-08 NOTE — Progress Notes (Signed)
Discharge instructions given to pt, verbalized understanding. Left the unit in stable condition. 

## 2013-07-08 NOTE — Progress Notes (Signed)
PT Cancellation Note / Screen  Patient Details Name: Janet IvoryDeana Tyler MRN: [id number] DOB: 08/17/1965   Cancelled Treatment:    Reason Eval/Treat Not Completed: PT screened, no needs identified, will sign off Pt standing up in room with family upon entering and reports d/c today.  Pt states no PT needs currently.   LEMYRE,KATHrine E 07/08/2013, 1:13 PM Zenovia JarredKati Lemyre, PT, DPT 07/08/2013 Pager: [phone]

## 2014-12-05 ENCOUNTER — Encounter (HOSPITAL_COMMUNITY): Payer: Self-pay

## 2014-12-05 ENCOUNTER — Emergency Department (HOSPITAL_COMMUNITY)
Admission: EM | Admit: 2014-12-05 | Discharge: 2014-12-05 | Disposition: A | Discharge status: Home / Self Care | Payer: Self-pay | Attending: Emergency Medicine | Admitting: Emergency Medicine

## 2014-12-05 ENCOUNTER — Emergency Department (HOSPITAL_COMMUNITY): Payer: Self-pay

## 2014-12-05 DIAGNOSIS — R2240 Localized swelling, mass and lump, unspecified lower limb: Secondary | ICD-10-CM | POA: Insufficient documentation

## 2014-12-05 DIAGNOSIS — K709 Alcoholic liver disease, unspecified: Secondary | ICD-10-CM | POA: Insufficient documentation

## 2014-12-05 DIAGNOSIS — R748 Abnormal levels of other serum enzymes: Secondary | ICD-10-CM

## 2014-12-05 DIAGNOSIS — R740 Nonspecific elevation of levels of transaminase and lactic acid dehydrogenase [LDH]: Secondary | ICD-10-CM | POA: Insufficient documentation

## 2014-12-05 DIAGNOSIS — R0602 Shortness of breath: Secondary | ICD-10-CM | POA: Insufficient documentation

## 2014-12-05 DIAGNOSIS — Z720 Tobacco use: Secondary | ICD-10-CM | POA: Insufficient documentation

## 2014-12-05 DIAGNOSIS — Z79899 Other long term (current) drug therapy: Secondary | ICD-10-CM | POA: Insufficient documentation

## 2014-12-05 LAB — COMPREHENSIVE METABOLIC PANEL
ALT: 24 U/L (ref 14–54)
AST: 75 U/L — ABNORMAL HIGH (ref 15–41)
Albumin: 3 g/dL — ABNORMAL LOW (ref 3.5–5.0)
Alkaline Phosphatase: 155 U/L — ABNORMAL HIGH (ref 38–126)
Anion gap: 8 (ref 5–15)
CHLORIDE: 103 mmol/L (ref 101–111)
CO2: 21 mmol/L — ABNORMAL LOW (ref 22–32)
Calcium: 8.9 mg/dL (ref 8.9–10.3)
Creatinine, Ser: 0.42 mg/dL — ABNORMAL LOW (ref 0.44–1.00)
GFR calc Af Amer: >60 mL/min (ref >60)
Glucose, Bld: 114 mg/dL — ABNORMAL HIGH (ref 65–99)
Potassium: 4 mmol/L (ref 3.5–5.1)
Sodium: 132 mmol/L — ABNORMAL LOW (ref 135–145)
Total Bilirubin: 2 mg/dL — ABNORMAL HIGH (ref 0.3–1.2)
Total Protein: 7.6 g/dL (ref 6.5–8.1)

## 2014-12-05 LAB — URINALYSIS, ROUTINE W REFLEX MICROSCOPIC
BILIRUBIN URINE: NEGATIVE
GLUCOSE, UA: NEGATIVE mg/dL
HGB URINE DIPSTICK: NEGATIVE
Ketones, ur: NEGATIVE mg/dL
Leukocytes, UA: NEGATIVE
Nitrite: NEGATIVE
PROTEIN: NEGATIVE mg/dL
Specific Gravity, Urine: 1.003 — ABNORMAL LOW (ref 1.005–1.030)
Urobilinogen, UA: 0.2 mg/dL (ref 0.0–1.0)
pH: 6 (ref 5.0–8.0)

## 2014-12-05 LAB — CBC WITH DIFFERENTIAL/PLATELET
BASOS ABS: 0 10*3/uL (ref 0.0–0.1)
Basophils Relative: 0 %
EOS PCT: 1 %
Eosinophils Absolute: 0.1 10*3/uL (ref 0.0–0.7)
HCT: 33.8 % — ABNORMAL LOW (ref 36.0–46.0)
Hemoglobin: 11.3 g/dL — ABNORMAL LOW (ref 12.0–15.0)
LYMPHS PCT: 18 %
Lymphs Abs: 1.6 10*3/uL (ref 0.7–4.0)
MCH: 34 pg (ref 26.0–34.0)
MCHC: 33.4 g/dL (ref 30.0–36.0)
MCV: 101.8 fL — AB (ref 78.0–100.0)
Monocytes Absolute: 1.1 10*3/uL — ABNORMAL HIGH (ref 0.1–1.0)
Monocytes Relative: 12 %
Neutro Abs: 6.4 10*3/uL (ref 1.7–7.7)
Neutrophils Relative %: 69 %
PLATELETS: 190 10*3/uL (ref 150–400)
RBC: 3.32 MIL/uL — AB (ref 3.87–5.11)
RDW: 15.4 % (ref 11.5–15.5)
WBC: 9.3 10*3/uL (ref 4.0–10.5)

## 2014-12-05 LAB — I-STAT TROPONIN, ED: TROPONIN I, POC: 0 ng/mL (ref 0.00–0.08)

## 2014-12-05 LAB — BRAIN NATRIURETIC PEPTIDE: B NATRIURETIC PEPTIDE 5: 124.8 pg/mL — AB (ref 0.0–100.0)

## 2014-12-05 LAB — PROTIME-INR
INR: 1.46 (ref 0.00–1.49)
Prothrombin Time: 17.8 seconds — ABNORMAL HIGH (ref 11.6–15.2)

## 2014-12-05 MED ORDER — ALBUTEROL SULFATE HFA 108 (90 BASE) MCG/ACT IN AERS
2.0000 | INHALATION_SPRAY | Freq: Once | RESPIRATORY_TRACT | Status: AC
  Administered 2014-12-05: 2 via RESPIRATORY_TRACT
  Filled 2014-12-05: qty 6.7

## 2014-12-05 NOTE — Progress Notes (Signed)
4:27pm. MD requests that CSW discuss outpatient resources with pt.   CSW met with pt, daughter and family friend at bedside. Pt reports that she has searched all over the internet for insurance options and low cost healthcare, but she has been unable to find anything. CSW provided information on [facility] and the orange card program. Also discussed etoh use. Pt states she is interested in quitting but know she needs help, stating she believes she needs a "nerve pill" otherwise she'll drink. CSW provided outpatient and residential SA resources and provided supportive counseling.  CSW staffed case with MD. CSW to sign off.  Allen Worker [facility] phone: [phone]

## 2014-12-05 NOTE — ED Notes (Signed)
She c/o generalized swelling, especially of her abdomen and legs "for a long time now".  She further c/o bilat ant. Ribs/chest area pain with cough for past couple of days.  EKG perfirmed at triage.

## 2014-12-05 NOTE — ED Notes (Signed)
md at bedside  Pt alert and oriented x4. Respirations even and unlabored, bilateral symmetrical rise and fall of chest. Skin warm and dry. In no acute distress. Denies needs.   

## 2014-12-05 NOTE — ED Provider Notes (Signed)
CSN: [id number]     Arrival date & time 12/05/14  1356 History   First MD Initiated Contact with Patient 12/05/14 1503     Chief Complaint  Patient presents with  . Shortness of Breath  . Edema     (Consider location/radiation/quality/duration/timing/severity/associated sxs/prior Treatment) Patient is a 49 y.o. female presenting with shortness of breath. The history is provided by the patient.  Shortness of Breath Severity:  Moderate Onset quality:  Gradual Duration:  9 months Timing:  Constant Progression:  Unchanged Chronicity:  New Associated symptoms: no chest pain, no fever, no headaches, no vomiting and no wheezing    49 yo F with a chief complaint of shortness of breath. This been going on for at least 9 months. Patient has a history of chronic alcoholism feels that her abdomen has become slowly more distended and thinks about the cause of her difficulty breathing. Patient is also a heavy smoker. She said she's cut down her drinking down to 6-10 beers a day. Tried to do not alcoholic but is unable to get nonalcoholic beer at the [employer] that across the street from her house. Patient has been taking diuretics from [facility] and feels that she has had some improvement with that. Patient has not seen a doctor and feels like it's because Obamacare didnt work for her.   History reviewed. No pertinent past medical history. No past surgical history on file. No family history on file. Social History  Substance Use Topics  . Smoking status: Current Every Day Smoker    Types: Cigarettes  . Smokeless tobacco: Never Used  . Alcohol Use: No   OB History    No data available     Review of Systems  Constitutional: Negative for fever and chills.  HENT: Negative for congestion and rhinorrhea.   Eyes: Negative for redness and visual disturbance.  Respiratory: Negative for shortness of breath and wheezing.   Cardiovascular: Negative for chest pain and palpitations.  Gastrointestinal:  Negative for nausea and vomiting.  Genitourinary: Negative for dysuria and urgency.  Musculoskeletal: Negative for myalgias and arthralgias.  Skin: Negative for pallor and wound.  Neurological: Negative for dizziness and headaches.      Allergies  Review of patient's allergies indicates no known allergies.  Home Medications   Prior to Admission medications   Medication Sig Start Date End Date Taking? Authorizing Provider  albuterol-ipratropium (COMBIVENT) 18-103 MCG/ACT inhaler Inhale 2 puffs into the lungs every 4 (four) hours as needed for wheezing or shortness of breath.   Yes Historical Provider, MD  OVER THE COUNTER MEDICATION Take 1 tablet by mouth daily.   Yes Historical Provider, MD  OVER THE COUNTER MEDICATION Take 2 tablets by mouth daily.   Yes Historical Provider, MD  folic acid (FOLVITE) 1 MG tablet Take 1 tablet (1 mg total) by mouth daily. Patient not taking: Reported on 12/05/2014 07/08/13   Belkys A Regalado, MD  LORazepam (ATIVAN) 1 MG tablet Take 1 tablet twice a day as needed for 3 days then 1 tablet as needed for 2 days then stopped. Patient not taking: Reported on 12/05/2014 07/08/13   Belkys A Regalado, MD  nicotine (NICODERM CQ - DOSED IN MG/24 HOURS) 21 mg/24hr patch Place 1 patch (21 mg total) onto the skin daily. Patient not taking: Reported on 12/05/2014 07/08/13   Belkys A Regalado, MD  pantoprazole (PROTONIX) 40 MG tablet Take 1 tablet (40 mg total) by mouth daily. Patient not taking: Reported on 12/05/2014 07/08/13  Belkys A Regalado, MD   BP 113/83 mmHg  Pulse 101  Temp(Src) 98.1 F (36.7 C) (Oral)  Resp 18  SpO2 99% Physical Exam  Constitutional: She is oriented to person, place, and time. She appears well-developed and well-nourished. No distress.  HENT:  Head: Normocephalic and atraumatic.  Eyes: EOM are normal. Pupils are equal, round, and reactive to light.  Neck: Normal range of motion. Neck supple.  Cardiovascular: Normal rate and regular  rhythm.  Exam reveals no gallop and no friction rub.   No murmur heard. Pulmonary/Chest: Effort normal. She has no wheezes. She has no rales. She exhibits no tenderness.  Abdominal: Soft. She exhibits distension (mild). There is no tenderness. There is no rebound.  Musculoskeletal: She exhibits edema (3+ to the thighs). She exhibits no tenderness.  Neurological: She is alert and oriented to person, place, and time.  Skin: Skin is warm and dry. She is not diaphoretic.  Psychiatric: She has a normal mood and affect. Her behavior is normal.  Nursing note and vitals reviewed.   ED Course  Procedures (including critical care time) Labs Review Labs Reviewed  CBC WITH DIFFERENTIAL/PLATELET - Abnormal; Notable for the following:    RBC 3.32 (*)    Hemoglobin 11.3 (*)    HCT 33.8 (*)    MCV 101.8 (*)    Monocytes Absolute 1.1 (*)    All other components within normal limits  COMPREHENSIVE METABOLIC PANEL - Abnormal; Notable for the following:    Sodium 132 (*)    CO2 21 (*)    Glucose, Bld 114 (*)    BUN <5 (*)    Creatinine, Ser 0.42 (*)    Albumin 3.0 (*)    AST 75 (*)    Alkaline Phosphatase 155 (*)    Total Bilirubin 2.0 (*)    All other components within normal limits  BRAIN NATRIURETIC PEPTIDE - Abnormal; Notable for the following:    B Natriuretic Peptide 124.8 (*)    All other components within normal limits  URINALYSIS, ROUTINE W REFLEX MICROSCOPIC (NOT AT [facility]) - Abnormal; Notable for the following:    Specific Gravity, Urine 1.003 (*)    All other components within normal limits  PROTIME-INR - Abnormal; Notable for the following:    Prothrombin Time 17.8 (*)    All other components within normal limits  I-STAT TROPOININ, ED    Imaging Review Dg Chest 2 View  12/05/2014  CLINICAL DATA:  Shortness of breath x 2-3 days EXAM: CHEST  2 VIEW COMPARISON:  None. FINDINGS: Small left pleural effusion. Mild patchy left basilar opacity, likely atelectasis. Right lung is clear.  No pneumothorax. The heart is normal in size. Mild degenerative changes of the visualized thoracolumbar spine. IMPRESSION: Small left pleural effusion. Mild patchy left basilar opacity, likely atelectasis. Electronically Signed   By: Charline BillsSriyesh  Krishnan M.D.   On: 12/05/2014 15:52   I have personally reviewed and evaluated these images and lab results as part of my medical decision-making.   EKG Interpretation   Date/Time:  Saturday December 05 2014 14:13:28 EST Ventricular Rate:  97 PR Interval:  141 QRS Duration: 77 QT Interval:  370 QTC Calculation: 470 R Axis:   54 Text Interpretation:  Sinus rhythm No old tracing to compare Confirmed by  Damaris Abeln MD, DANIEL [phone]([id number]) on 12/05/2014 4:20:23 PM      MDM   Final diagnoses:  SOB (shortness of breath)  Abnormal transaminases  Alcoholic liver disease (HCC)    49 yo  F with a chief complaint shortness of breath. Patient is 100% on room air. Mildly distended abdomen nontender to palpation. Significant lower extremity edema 3+ bilaterally up to the thighs. Patient states is actually improved from her baseline. See no need for urgent paracentesis at this time. Will likely need outpatient follow-up. Consult social work.  Patient with labs with mild LFT elevation.  BNP mildly elevated.  Will give dose of lasix here iv, start on low dose.  PCP follow up for potassium recheck on Monday.   11:58 PM:  I have discussed the diagnosis/risks/treatment options with the patient and believe the pt to be eligible for discharge home to follow-up with PCP. We also discussed returning to the ED immediately if new or worsening sx occur. We discussed the sx which are most concerning (e.g., sudden worsening sob) that necessitate immediate return. Medications administered to the patient during their visit and any new prescriptions provided to the patient are listed below.  Medications given during this visit Medications  albuterol (PROVENTIL HFA;VENTOLIN HFA) 108  (90 BASE) MCG/ACT inhaler 2 puff (2 puffs Inhalation Given 12/05/14 1648)    Discharge Medication List as of 12/05/2014  4:36 PM      The patient appears reasonably screen and/or stabilized for discharge and I doubt any other medical condition or other [facility] requiring further screening, evaluation, or treatment in the ED at this time prior to discharge.    Melene Plan, DO 12/05/14 2359

## 2014-12-05 NOTE — ED Notes (Signed)
EKG performed at triage. 

## 2014-12-05 NOTE — Discharge Instructions (Signed)
°Emergency Department Resource Guide °1) Find a Doctor and Pay Out of Pocket °Although you won't have to find out who is covered by your insurance plan, it is a good idea to ask around and get recommendations. You will then need to call the office and see if the doctor you have chosen will accept you as a new patient and what types of options they offer for patients who are self-pay. Some doctors offer discounts or will set up payment plans for their patients who do not have insurance, but you will need to ask so you aren't surprised when you get to your appointment. ° °2) Contact Your Local Health Department °Not all health departments have doctors that can see patients for sick visits, but many do, so it is worth a call to see if yours does. If you don't know where your local health department is, you can check in your phone book. The CDC also has a tool to help you locate your state's health department, and many state websites also have listings of all of their local health departments. ° °3) Find a Walk-in Clinic °If your illness is not likely to be very severe or complicated, you may want to try a walk in clinic. These are popping up all over the country in pharmacies, drugstores, and shopping centers. They're usually staffed by nurse practitioners or physician assistants that have been trained to treat common illnesses and complaints. They're usually fairly quick and inexpensive. However, if you have serious medical issues or chronic medical problems, these are probably not your best option. ° °No Primary Care Doctor: °- Call Health Connect at  832-8000 - they can help you locate a primary care doctor that  accepts your insurance, provides certain services, etc. °- Physician Referral Service- 1-800-533-3463 ° °Chronic Pain Problems: °Organization         Address  Phone   Notes  °[facility]  (336) 297-2271 Patients need to be referred by their primary care doctor.  ° °Medication  Assistance: °Organization         Address  Phone   Notes  °[place] [facility] [address] °[place], [place] [address] (336) 641-8030 --Must be a resident of [place] °-- Must have NO insurance coverage whatsoever (no Medicaid/ Medicare, etc.) °-- The pt. MUST have a primary care doctor that directs their care regularly and follows them in the community °  °[facility]  (866) 331-1348   °[employer]  (888) 892-1162   ° °Agencies that provide inexpensive medical care: °Organization         Address  Phone   Notes  °[facility]  (336) 832-8035   °[facility]    (336) 832-7272   °[facility] [address] °[place], [place] [address] (336) 832-4777   °[facility] [address], °[place] (336) 271-4999   °[facility]    (336) 373-0678   °[facility]    (336) 272-1050   °[facility] ° [address], [place] Phone:  (336) 832-4444, Fax:  (336) 832-4440 Hours of Operation:  9 am - 6 pm, M-F.  Also accepts Medicaid/Medicare and self-pay.  °[facility] ° [address], [place] Phone: (336) [phone], Fax: (336) [phone]. Hours of Operation:  8:30 am - 5:30 pm, M-F.  Also accepts Medicaid and self-pay.  °[facility] [place] [address], [place] Phone: (336) 878-6027   °[facility] [address], [place], [place] (336)723-1848, Ext. 123 Mondays & Thursdays: 7-9 AM.  First 15 patients are seen on a first come, first serve basis. °  ° °Medicaid-accepting [place] Providers: ° °Organization         Address  Phone   Notes  °[facility] [address], [place] (336) 641-2100 Also accepts self-pay patients.  °[facility] [address], [place] ° (336) 856-9996   °[facility] [address], [place]  (336) 288-8857   °[facility] [address] [place] [address], [place] (336) 299-7000   °Veita Bland [address], [place]  ° (336) 373-1557 Only accepts [place] Access Medicaid patients after they have their name applied to their card.  ° °Self-Pay (no insurance) in [place]: ° °Organization         Address  Phone   Notes  °Sickle Cell Patients, [facility] [address], [place] (336) 832-1970   °[facility] [address], [place] (336) 832-4400   °[facility] ° [address], [place] (336) 992-4800   °[facility]/Dr. Osei-Bonsu ° [address] [place] [address], [place] or [address], [place] (336) [phone] Phone number for both [place] and [place] locations is the same.  °[facility] [address], [place] (336) 299-0000   °[facility] [address] [place] [address], [place] or [address] (336) 852-7530 °(336) 878-2260   °[facility] [address], [place] (336) 350-1642, phone; (336) 294-5005, fax Sees patients 1st and 3rd Saturday of every month.  Must not qualify for public or private insurance (i.e. Medicaid, Medicare, [place] Health Choice, Veterans' Benefits) • Household income should be no more than 200% of the poverty level •The clinic cannot treat you if you are pregnant or think you are pregnant • Sexually transmitted diseases are not treated at the clinic.  ° ° °Dental Care: °Organization         Address  Phone  Notes  °[place] [facility] [address], [place] (336) 641-6152 Accepts children up to age 21 who are enrolled in Medicaid or [place] Health Choice; pregnant women with a Medicaid card; and children who have applied for Medicaid or [place] Health Choice, but were declined, whose parents can pay a reduced fee at time of service.  °[place]  [facility] [place]  [address], [place] (336) 641-7733 Accepts children up to age 21 who are enrolled in Medicaid or [place] Health Choice; pregnant women with a Medicaid card; and children who have applied for Medicaid or [place] Health Choice, but were declined, whose parents can pay a reduced fee at time of service.  °[facility] ° [address], [place] (336) 641-4533 Patients are seen by appointment only. Walk-ins are not accepted. [facility] will see patients 18 years of age and older. °Monday - Tuesday (8am-5pm) °Most Wednesdays (8:30-5pm) °$30 per visit, cash only  °[facility] ° [address], [place] (336) 641-4533 Patients are seen by appointment only. Walk-ins are not accepted. [facility] will see patients 18 years of age and older. °One   Wednesday Evening (Monthly: Volunteer Based).  $30 per visit, cash only  °[facility]  (919) [phone] for adults; Children under age 4, call Graduate Pediatric Dentistry at ([phone]) [phone]. Children aged 4-14, please call ([phone]) [phone] to request a pediatric application. ° Dental services are provided in all areas of dental care including fillings, crowns and bridges, complete and partial dentures, implants, gum treatment, root canals, and extractions. Preventive care is also provided. Treatment is provided to both adults and children. °Patients are selected via a lottery and there is often a waiting list. °  °[facility] [address], °[place] ° (336) 763-8833 [email or web address] °  °[facility] [address], [place], [place] (336)723-1848, Ext. 123 Second and Fourth Thursday of each month, opens at 6:30 AM; Clinic ends at 9 AM.  Patients are seen on a first-come first-served basis, and a limited number are seen during each clinic.  ° °[facility] ° [address], [place], [place] (336) 723-7904    Eligibility Requirements °You must have lived in [place], [place], or [place] counties for at least the last three months. °  You cannot be eligible for state or federal sponsored healthcare insurance, including Veterans Administration, Medicaid, or Medicare. °  You generally cannot be eligible for healthcare insurance through your employer.  °  How to apply: °Eligibility screenings are held every Tuesday and Wednesday afternoon from 1:00 pm until 4:00 pm. You do not need an appointment for the interview!  °[facility] [address], [place], [place] 336-631-2330   °[facility]  336-342-8273   °[facility]  336-703-3100   °[facility]  336-570-6415   ° °Behavioral Health Resources in the Community: °Intensive Outpatient Programs °Organization         Address  Phone  Notes  °[facility] [address], [place], [place] 336-878-6098   °[facility] [address], [place], [place] 336-832-9800   °[facility] [address], [place], [place] ° 336-882-2125   °[facility] [address],  °[place], [place] 1-800-853-5163 or 336-641-4981   °Substance Abuse Resources °Organization         Address  Phone  Notes  °[facility]  336-882-2125   °[facility]  336-784-9470   °[facility]  336-285-9073   °[facility]  336-845-3988   °Residential & Outpatient Substance Abuse Program  1-800-659-3381   °Psychological Services °Organization         Address  Phone  Notes  °[facility]  336- 832-9600   °[facility]  336- 378-7881   °[facility] [address], [place] 1-800-853-5163 or 336-641-4981   ° °Mobile Crisis Teams °Organization         Address  Phone  Notes  °[facility], Mobile Crisis Care Unit  1-877-626-1772   °Assertive °[facility] ° [address]  [place], [place] 336-834-9664   °Sharon DeEsch [address] °[place] 336-554-5454   ° °Self-Help/Support Groups °Organization         Address  Phone             Notes  °[facility]  - variety of support groups  336- 373-1402 Call for more information  °Narcotics Anonymous (NA), [facility] [address], °[place]  2 meetings at this location  ° °  Residential Treatment Programs Organization         Address  Phone  Notes  [facility] [address],    [place]  [phone]   [facility]  [address], [place] [phone], [place], [place] [phone]   [facility] [address], [place] [phone] Admissions: 8am-3pm M-F  [facility] [address]    [place], [place] [phone]   [facility] [address], [place], [place] [phone]   [facility] [address]  [place], [place] [phone]   [facility] Intensive Outpatient [address], [place], [place] [phone]   [facility] ([facility] [address]  [place], [place] [phone] or [phone]   [facility] ([facility] [address] [place], [place] [phone] Accepts Medicaid  [facility] [address]  [place] [phone] Substance Abuse/Addiction Treatment   [facility]         Address  Phone  Notes  [facility]  215-758-4694(888) (219)[phone]   Angie FavaJulie Brannon, PhD [address], [address] [place], [place]   [phone](336) [phone] or [phone](336) [phone]   [facility]   [address] [place], [place] [phone](336) [phone]   [facility] [address], [place], [place] ([phone]) [phone](336) [phone] Insurance/Medicaid/sponsorship through [facility] [address]                                    [place], [place] [phone]) [phone] Therapy/tele-psych/case    [facility] [address]   [place], [place] [phone]) [phone]    Dr. Lolly MustacheArfeen  [phone]) [phone]   [facility]  [employer] [facility] 1) [address], [place] 2) [address], [place] 3)  [address], [place] [phone]  [phone]   [facility] Child Abuse Hotline [phone](336) (361) [phone] or [phone](336) [phone] (After Hours)         Shortness of Breath Shortness of breath means you have trouble breathing. Shortness of breath needs medical care right away. HOME CARE   Do not smoke.  Avoid being around chemicals or things (paint fumes, dust) that may bother your breathing.  Rest as needed. Slowly begin your normal activities.  Only take medicines as told by your doctor.  Keep all doctor visits as told. GET HELP RIGHT AWAY IF:   Your shortness of breath gets worse.  You feel lightheaded, pass out (faint), or have a cough that is not helped by medicine.  You cough up blood.  You have pain with breathing.  You have pain in your chest, arms, shoulders, or belly (abdomen).  You have a fever.  You cannot walk up stairs or exercise the way you normally do.  You do not get better in the time expected.  You have a hard time doing normal activities even with rest.  You have problems with your medicines.  You have any new symptoms. MAKE SURE YOU:  Understand these instructions.  Will watch your condition.  Will get help right away if you are not doing well or get worse.   This information is not intended to replace advice given to you by your health care provider. Make sure you discuss any questions you have with your health care  provider.   Document Released: 06/28/2007 Document Revised: 01/14/2013 Document Reviewed: 03/27/2011 Elsevier Interactive Patient Education [employer]

## 2014-12-21 ENCOUNTER — Encounter: Payer: Self-pay | Admitting: Family Medicine

## 2014-12-21 ENCOUNTER — Ambulatory Visit: Payer: Self-pay | Attending: Family Medicine | Admitting: Family Medicine

## 2014-12-21 VITALS — BP 120/79 | HR 98 | Temp 98.6°F | Resp 15 | Ht 65.5 in | Wt 159.4 lb

## 2014-12-21 DIAGNOSIS — K7031 Alcoholic cirrhosis of liver with ascites: Secondary | ICD-10-CM | POA: Insufficient documentation

## 2014-12-21 DIAGNOSIS — Z720 Tobacco use: Secondary | ICD-10-CM | POA: Insufficient documentation

## 2014-12-21 DIAGNOSIS — R6889 Other general symptoms and signs: Secondary | ICD-10-CM | POA: Insufficient documentation

## 2014-12-21 DIAGNOSIS — F2089 Other schizophrenia: Secondary | ICD-10-CM | POA: Insufficient documentation

## 2014-12-21 LAB — COMPREHENSIVE METABOLIC PANEL
ALBUMIN: 2.9 g/dL — AB (ref 3.6–5.1)
ALT: 23 U/L (ref 6–29)
AST: 68 U/L — AB (ref 10–35)
Alkaline Phosphatase: 130 U/L — ABNORMAL HIGH (ref 33–115)
BILIRUBIN TOTAL: 1.6 mg/dL — AB (ref 0.2–1.2)
BUN: 4 mg/dL — AB (ref 7–25)
CHLORIDE: 100 mmol/L (ref 98–110)
CO2: 26 mmol/L (ref 20–31)
CREATININE: 0.45 mg/dL — AB (ref 0.50–1.10)
Calcium: 8.7 mg/dL (ref 8.6–10.2)
GLUCOSE: 109 mg/dL — AB (ref 65–99)
Potassium: 4.9 mmol/L (ref 3.5–5.3)
SODIUM: 132 mmol/L — AB (ref 135–146)
Total Protein: 6.6 g/dL (ref 6.1–8.1)

## 2014-12-21 LAB — POCT INR: INR: 1.5

## 2014-12-21 MED ORDER — SPIRONOLACTONE 25 MG PO TABS: 25.0000 mg | ORAL_TABLET | Freq: Every day | ORAL | Status: DC

## 2014-12-21 MED ORDER — FUROSEMIDE 20 MG PO TABS: 20.0000 mg | ORAL_TABLET | Freq: Every day | ORAL | Status: DC

## 2014-12-21 NOTE — Progress Notes (Signed)
CC: ED follow up  HPI: Janet Tyler is a 49 y.o. female with a history of alcoholic liver cirrhosis (diagnosed in 09/2014), schizophrenia, bipolar disorder who was recently seen at [facility] ED on 12/05/14 for abdominal pain and shortness of breath as well as pedal edema and was treated with IV Lasix and discharged to follow here at the clinic.  She complains of discharge from the ED she has felt worse with shortness of breath improving when she lies down flat and worsens when she sits up which she had informed the ED physician of but states that throughout her visit she was lying on her back and so her symptoms were not as bad. She complains of abdominal discomfort as well and swelling of the legs and states she has stopped drinking alcoholic beverages and only drinks nonalcoholic beers. Her last person today as well as when she was admitted by no current health in September of this year. Also complains of cold intolerance.  She also has a history of bipolar disorder, schizophrenia and was previously managed by [facility] but then was lost to follow-up but is not on any medications at this time. She is also not taking any medications for her liver cirrhosis   No Known Allergies No past medical history on file. Current Outpatient Prescriptions on File Prior to Visit  Medication Sig Dispense Refill  . albuterol-ipratropium (COMBIVENT) 18-103 MCG/ACT inhaler Inhale 2 puffs into the lungs every 4 (four) hours as needed for wheezing or shortness of breath.    . folic acid (FOLVITE) 1 MG tablet Take 1 tablet (1 mg total) by mouth daily. (Patient not taking: Reported on 12/05/2014) 30 tablet 0  . LORazepam (ATIVAN) 1 MG tablet Take 1 tablet twice a day as needed for 3 days then 1 tablet as needed for 2 days then stopped. (Patient not taking: Reported on 12/05/2014) 10 tablet 0  . nicotine (NICODERM CQ - DOSED IN MG/24 HOURS) 21 mg/24hr patch Place 1 patch (21 mg total) onto the skin daily. (Patient not  taking: Reported on 12/05/2014) 28 patch 0  . OVER THE COUNTER MEDICATION Take 1 tablet by mouth daily.    Marland Kitchen OVER THE COUNTER MEDICATION Take 2 tablets by mouth daily.    . pantoprazole (PROTONIX) 40 MG tablet Take 1 tablet (40 mg total) by mouth daily. (Patient not taking: Reported on 12/05/2014) 30 tablet 0   No current facility-administered medications on file prior to visit.   No family history on file. Social History   Social History  . Marital Status: Single    Spouse Name: N/A  . Number of Children: N/A  . Years of Education: N/A   Occupational History  . Not on file.   Social History Main Topics  . Smoking status: Current Every Day Smoker    Types: Cigarettes  . Smokeless tobacco: Never Used  . Alcohol Use: No  . Drug Use: Not on file  . Sexual Activity: Not on file   Other Topics Concern  . Not on file   Social History Narrative    Review of Systems: Constitutional: Negative for fever, chills, diaphoresis, activity change, appetite change and fatigue. HENT: Negative for ear pain, nosebleeds, congestion, facial swelling, rhinorrhea, neck pain, neck stiffness and ear discharge.  Eyes: Negative for pain, discharge, redness, itching and visual disturbance. Respiratory: Negative for cough, choking, chest tightness, positive for shortness of breath, negative for wheezing and stridor.  Cardiovascular: Negative for chest pain, palpitations and leg swelling. Gastrointestinal:  positive for abdominal distention and pain. Genitourinary: Negative for dysuria, urgency, frequency, hematuria, flank pain, decreased urine volume, difficulty urinating and dyspareunia.  Musculoskeletal: Negative for back pain, joint swelling, arthralgias and gait problem. Neurological: Negative for dizziness, tremors, seizures, syncope, facial asymmetry, speech difficulty, weakness, light-headedness, numbness and headaches.  Hematological: Negative for adenopathy. Does not bruise/bleed  easily. Psychiatric/Behavioral: Negative for hallucinations, behavioral problems, confusion, dysphoric mood, decreased concentration and agitation.    Objective: Filed Vitals:   12/21/14 1225  BP: 120/79  Pulse: 98  Temp: 98.6 F (37 C)  Resp: 15  Height: 5' 5.5" (1.664 m)  Weight: 159 lb 6.4 oz (72.303 kg)  SpO2: 100%      Physical Exam: Constitutional: Patient appears well-developed and well-nourished. No distress. HENT: Normocephalic, atraumatic, External right and left ear normal. Oropharynx is clear and moist.  Eyes: Conjunctivae and EOM are normal. PERRLA, no scleral icterus. Neck: Normal ROM. Neck supple. No JVD. No tracheal deviation. No thyromegaly. CVS: RRR, S1/S2 +, no murmurs, no gallops, no carotid bruit.  Pulmonary: Effort and breath sounds normal, no stridor, rhonchi, wheezes, rales.  Abdominal: ascites with abdominal girth measuring 40 inches..  Musculoskeletal: Normal range of motion. No edema and no tenderness.  Lymphadenopathy: No lymphadenopathy noted, cervical, inguinal or axillary Neuro: Alert. Normal reflexes, muscle tone coordination. No cranial nerve deficit. Skin: spider naevi on anterior abdominal wall Psychiatric: Normal mood and affect. Behavior, judgment, thought content normal.  Lab Results  Component Value Date   WBC 9.3 12/05/2014   HGB 11.3* 12/05/2014   HCT 33.8* 12/05/2014   MCV 101.8* 12/05/2014   PLT 190 12/05/2014   Lab Results  Component Value Date   CREATININE 0.42* 12/05/2014   BUN <5* 12/05/2014   NA 132* 12/05/2014   K 4.0 12/05/2014   CL 103 12/05/2014   CO2 21* 12/05/2014       Assessment and plan:  Alcoholic liver cirrhosis with ascites: Patient is symptomatic at this time Commenced on spironolactone as well as Lasix. We'll send off labs to evaluate for hypokalemia. She will need ultrasound-guided abdominal paracentesis and we will call the hospital to schedule this She needs a GI referral and has been informed to  expedite her [place] discount application and inform the clinic once this is set up so she can be referred.  Cold intolerance: TSH sent off to evaluate for hypo-thyroidism  Schizophrenia/anxiety/depression: LCSW called in to see the patient for counseling She is currently not on any antipsychotics and I have advised her to return back to [facility] where she was previously managed so she can placed back on her medications.  Tobacco abuse: Spent 4 minutes discussing cessation and she is not ready to quit at this time.       Jaclyn ShaggyEnobong, Amao, MD. [facility] [phone] 12/21/2014, 12:21 PM

## 2014-12-21 NOTE — Progress Notes (Signed)
Patient here to establish care She reports she has ETOH cirrhosis and that her last drink was on 12/05/14 she has been drinking non-alcoholic beers at home and quit "cold [place]key" She states she used to drink roughly 9 beers per day since she was a teen She reports mild SOB today that is better when she is laying down She has generalized pain that she describes as a dull ache that is intermittent She is not taking any prescription or over the counter medications She is applying for the orange card

## 2014-12-21 NOTE — Patient Instructions (Signed)
Cirrhosis °Cirrhosis is long-term (chronic) liver injury. The liver is your largest internal organ, and it performs many functions. The liver converts food into energy, removes toxic material from your blood, makes important proteins, and absorbs necessary vitamins from your diet. °If you have cirrhosis, it means many of your healthy liver cells have been replaced by scar tissue. This prevents blood from flowing through your liver, which makes it difficult for your liver to function. This scarring is not reversible, but treatment can prevent it from getting worse.  °CAUSES  °Hepatitis C and long-term alcohol abuse are the most common causes of cirrhosis. Other causes include: °· Nonalcoholic fatty liver disease. °· Hepatitis B infection. °· Autoimmune hepatitis. °· Diseases that cause blockage of ducts inside the liver. °· Inherited liver diseases. °· Reactions to certain long-term medicines. °· Parasitic infections. °· Long-term exposure to certain toxins. °RISK FACTORS °You may have a higher risk of cirrhosis if you: °· Have certain hepatitis viruses. °· Abuse alcohol, especially if you are female. °· Are overweight. °· Share needles. °· Have unprotected sex with someone who has hepatitis. °SYMPTOMS  °You may not have any signs and symptoms at first. Symptoms may not develop until the damage to your liver starts to get worse. Signs and symptoms of cirrhosis may include:  °· Tenderness in the right-upper part of your abdomen. °· Weakness and tiredness (fatigue). °· Loss of appetite. °· Nausea. °· Weight loss and muscle loss. °· Itchiness. °· Yellow skin and eyes (jaundice). °· Buildup of fluid in the abdomen (ascites). °· Swelling of the feet and ankles (edema). °· Appearance of tiny blood vessels under the skin. °· Mental confusion. °· Easy bruising and bleeding. °DIAGNOSIS  °Your health care provider may suspect cirrhosis based on your symptoms and medical history, especially if you have other medical conditions  or a history of alcohol abuse. Your health care provider will do a physical exam to feel your liver and check for signs of cirrhosis. Your health care provider may perform other tests, including:  °· Blood tests to check:   °¨ Whether you have hepatitis B or C.   °¨ Kidney function. °¨ Liver function. °· Imaging tests such as: °¨ MRI or CT scan to look for changes seen in advanced cirrhosis. °¨ Ultrasound to see if normal liver tissue is being replaced by scar tissue. °· A procedure using a long needle to take a sample of liver tissue (biopsy) for examination under a microscope. Liver biopsy can confirm the diagnosis of cirrhosis.   °TREATMENT  °Treatment depends on how damaged your liver is and what caused the damage. Treatment may include treating cirrhosis symptoms or treating the underlying causes of the condition to try to slow the progression of the damage. Treatment may include: °· Making lifestyle changes, such as:   °¨ Eating a healthy diet. °¨ Restricting salt intake.  °¨ Maintaining a healthy weight.   °¨ Not abusing drugs or alcohol. °· Taking medicines to: °¨ Treat liver infections or other infections. °¨ Control itching. °¨ Reduce fluid buildup. °¨ Reduce certain blood toxins. °¨ Reduce risk of bleeding from enlarged blood vessels in the stomach or esophagus (varices). °· If varices are causing bleeding problems, you may need treatment with a procedure that ties up the vessels causing them to fall off (band ligation). °· If cirrhosis is causing your liver to fail, your health care provider may recommend a liver transplant. °· Other treatments may be recommended depending on any complications of cirrhosis, such as liver-related kidney failure (hepatorenal   syndrome). °HOME CARE INSTRUCTIONS  °· Take medicines only as directed by your health care provider. Do not use drugs that are toxic to your liver. Ask your health care provider before taking any new medicines, including over-the-counter medicines.    °· Rest as needed. °· Eat a well-balanced diet. Ask your health care provider or dietitian for more information.   °· You may have to follow a low-salt diet or restrict your water intake as directed. °· Do not drink alcohol. This is especially important if you are taking acetaminophen. °· Keep all follow-up visits as directed by your health care provider. This is important. °SEEK MEDICAL CARE IF: °· You have fatigue or weakness that is getting worse. °· You develop swelling of the hands, feet, legs, or face. °· You have a fever. °· You develop loss of appetite. °· You have nausea or vomiting. °· You develop jaundice. °· You develop easy bruising or bleeding. °SEEK IMMEDIATE MEDICAL CARE IF: °· You vomit bright red blood or a material that looks like coffee grounds. °· You have blood in your stools. °· Your stools appear black and tarry. °· You become confused. °· You have chest pain or trouble breathing. °  °This information is not intended to replace advice given to you by your health care provider. Make sure you discuss any questions you have with your health care provider. °  °Document Released: 01/09/2005 Document Revised: 01/30/2014 Document Reviewed: 09/17/2013 °Elsevier Interactive Patient Education ©2016 Elsevier Inc. ° °

## 2014-12-22 DIAGNOSIS — F209 Schizophrenia, unspecified: Secondary | ICD-10-CM | POA: Insufficient documentation

## 2014-12-22 LAB — TSH: TSH: 1.461 u[IU]/mL (ref 0.350–4.500)

## 2014-12-22 LAB — AMMONIA: AMMONIA: 41 umol/L (ref 16–53)

## 2014-12-24 ENCOUNTER — Telehealth: Payer: Self-pay | Admitting: *Deleted

## 2014-12-24 NOTE — Telephone Encounter (Signed)
-----   Message from Jaclyn ShaggyEnobong Amao, MD sent at 12/22/2014  5:00 PM EST ----- Liver enzymes are elevated due to liver cirrhosis; please advise her to expedite her application [facility] so GI referral can be placed.

## 2014-12-24 NOTE — Telephone Encounter (Signed)
Left HIPAA compliant message for patient to call Tobi Bastosnna, RN at clinic at [phone]

## 2014-12-25 NOTE — Telephone Encounter (Signed)
Patient returned RN call from yesterday to get results. RN verified name and date of birth and gave results that her liver enzymes are elevated due to liver cirrhosis; RN advised her to expedite her application for [facility] so GI referral can be placed.  Patient verbalized understanding but had questions about financial application-RN spoke with Sedalia Mutaiane, financial counselor and transferred patient to speak with Diane.  Patient also had questions about her sodium being low.  RN (spoke to MD) informed patient that is related to her ascites and patient verbalized understanding saying that she is to have paracentesis on 12/5.

## 2014-12-28 ENCOUNTER — Other Ambulatory Visit: Payer: Self-pay | Admitting: Family Medicine

## 2014-12-28 ENCOUNTER — Telehealth: Payer: Self-pay | Admitting: Clinical

## 2014-12-28 ENCOUNTER — Ambulatory Visit (HOSPITAL_COMMUNITY)
Admission: RE | Admit: 2014-12-28 | Discharge: 2014-12-28 | Disposition: A | Discharge status: Home / Self Care | Payer: Self-pay | Source: Ambulatory Visit | Attending: Family Medicine | Admitting: Family Medicine

## 2014-12-28 DIAGNOSIS — K7031 Alcoholic cirrhosis of liver with ascites: Secondary | ICD-10-CM

## 2014-12-28 NOTE — Progress Notes (Signed)
The patient presented for therapeutic paracentesis, upon reviewing images minimal amount of ascites is seen and patient states her symptoms have improved completely after starting Lasix. Risks and Benefits discussed with the patient and given her lack of symptoms the procedure was cancelled.   Pattricia BossKoreen Jonella Redditt PA-C Interventional Radiology  12/28/14  11:35 AM

## 2014-12-28 NOTE — Telephone Encounter (Signed)
Left HIPPA-compliant message to return [facility] [phone]. Attempt to f/u with patient.

## 2014-12-29 ENCOUNTER — Ambulatory Visit: Payer: Self-pay | Attending: Family Medicine

## 2015-01-11 ENCOUNTER — Ambulatory Visit: Payer: Self-pay

## 2015-01-11 ENCOUNTER — Ambulatory Visit: Payer: Self-pay | Admitting: Family Medicine

## 2015-01-11 ENCOUNTER — Other Ambulatory Visit: Payer: Self-pay

## 2015-02-03 ENCOUNTER — Ambulatory Visit: Payer: Self-pay

## 2015-02-03 ENCOUNTER — Encounter (HOSPITAL_BASED_OUTPATIENT_CLINIC_OR_DEPARTMENT_OTHER): Payer: Self-pay | Admitting: Clinical

## 2015-02-03 ENCOUNTER — Ambulatory Visit: Payer: Self-pay | Attending: Family Medicine | Admitting: Family Medicine

## 2015-02-03 ENCOUNTER — Ambulatory Visit (HOSPITAL_BASED_OUTPATIENT_CLINIC_OR_DEPARTMENT_OTHER): Payer: Self-pay | Admitting: Clinical

## 2015-02-03 ENCOUNTER — Encounter: Payer: Self-pay | Admitting: Family Medicine

## 2015-02-03 VITALS — BP 103/70 | HR 103 | Temp 98.4°F | Resp 13 | Ht 65.5 in | Wt 167.8 lb

## 2015-02-03 DIAGNOSIS — M5441 Lumbago with sciatica, right side: Secondary | ICD-10-CM

## 2015-02-03 DIAGNOSIS — F2089 Other schizophrenia: Secondary | ICD-10-CM

## 2015-02-03 DIAGNOSIS — K7031 Alcoholic cirrhosis of liver with ascites: Secondary | ICD-10-CM

## 2015-02-03 DIAGNOSIS — Z131 Encounter for screening for diabetes mellitus: Secondary | ICD-10-CM

## 2015-02-03 LAB — POCT GLYCOSYLATED HEMOGLOBIN (HGB A1C): Hemoglobin A1C: 4.4

## 2015-02-03 LAB — BASIC METABOLIC PANEL
BUN: 3 mg/dL — AB (ref 7–25)
CALCIUM: 8.4 mg/dL — AB (ref 8.6–10.2)
CO2: 23 mmol/L (ref 20–31)
CREATININE: 0.51 mg/dL (ref 0.50–1.10)
Chloride: 106 mmol/L (ref 98–110)
GLUCOSE: 106 mg/dL — AB (ref 65–99)
POTASSIUM: 4.7 mmol/L (ref 3.5–5.3)
Sodium: 134 mmol/L — ABNORMAL LOW (ref 135–146)

## 2015-02-03 MED ORDER — FUROSEMIDE 40 MG PO TABS: 40.0000 mg | ORAL_TABLET | Freq: Every day | ORAL | Status: DC

## 2015-02-03 MED ORDER — METHOCARBAMOL 500 MG PO TABS
500.0000 mg | ORAL_TABLET | Freq: Three times a day (TID) | ORAL | Status: DC | PRN
Start: 2015-02-03 — End: 2015-04-28

## 2015-02-03 NOTE — Progress Notes (Signed)
Patient here for follow up on her liver cirrhosis She reports generalized pain 8/10-abdomen,teeth,back She reports self dosing her lasix to two pills  Every day and states that she feels it is not working She went to [employer] [facility] and purchased a liver supplement and a diuretic with potassium that she feels works better She also states that she does not use the inhaler because she read it interferes with the liver She also has questions regarding her diet and foods she should and shouldn't eat

## 2015-02-03 NOTE — Progress Notes (Signed)
Subjective:  Patient ID: Janet Tyler, female    DOB: [birth year]  Age: 50 y.o. MRN: [id number]  CC: Follow-up and Cirrhosis   HPI Janet Tyler is a 50 year old female with a history of alcoholic liver cirrhosis, schizophrenia who comes into the clinic for a follow-up visit. She was seen by the LCSW last time she was in the office and was referred to mental health due to the fact that she is not on medications for schizophrenia but apparently she failed to follow-up; she was previously followed by [facility] but then was lost to follow-up.  She was referred for abdominal paracentesis on 12/28/14 which was not done as imaging revealed minimal ascites and the patient was discharged home. She has been needing to take 2 of her 20 mg Lasix pills due to her pedal edema with resulting improvement but then ran out of her Lasix and so has been using an OTC medication for pedal edema from [facility]. Complains of low back pain in the lumbar region which radiates to her left buttock and she also has neck pain anytime she bends her neck and complains of tingling all over her body.  Outpatient Prescriptions Prior to Visit  Medication Sig Dispense Refill  . albuterol-ipratropium (COMBIVENT) 18-103 MCG/ACT inhaler Inhale 2 puffs into the lungs every 4 (four) hours as needed for wheezing or shortness of breath.    . folic acid (FOLVITE) 1 MG tablet Take 1 tablet (1 mg total) by mouth daily. (Patient not taking: Reported on 12/05/2014) 30 tablet 0  . LORazepam (ATIVAN) 1 MG tablet Take 1 tablet twice a day as needed for 3 days then 1 tablet as needed for 2 days then stopped. (Patient not taking: Reported on 12/05/2014) 10 tablet 0  . nicotine (NICODERM CQ - DOSED IN MG/24 HOURS) 21 mg/24hr patch Place 1 patch (21 mg total) onto the skin daily. (Patient not taking: Reported on 12/05/2014) 28 patch 0  . OVER THE COUNTER MEDICATION Take 1 tablet by mouth daily.    Marland Kitchen OVER THE COUNTER MEDICATION Take 2 tablets by mouth daily.     . pantoprazole (PROTONIX) 40 MG tablet Take 1 tablet (40 mg total) by mouth daily. (Patient not taking: Reported on 12/05/2014) 30 tablet 0  . spironolactone (ALDACTONE) 25 MG tablet Take 1 tablet (25 mg total) by mouth daily. 30 tablet 3  . furosemide (LASIX) 20 MG tablet Take 1 tablet (20 mg total) by mouth daily. 30 tablet 3   No facility-administered medications prior to visit.    ROS Review of Systems Constitutional: Negative for fever, chills, diaphoresis, activity change, appetite change and fatigue. HENT: Negative for ear pain, nosebleeds, congestion, facial swelling, rhinorrhea, neck pain, neck stiffness and ear discharge.  Eyes: Negative for pain, discharge, redness, itching and visual disturbance. Respiratory: Negative for cough, choking, chest tightness, positive for shortness of breath, negative for wheezing and stridor.  Cardiovascular: Negative for chest pain, palpitations and positive for leg swelling. Gastrointestinal: positive for abdominal distention and pain. Genitourinary: Negative for dysuria, urgency, frequency, hematuria, flank pain, decreased urine volume, difficulty urinating and dyspareunia.  Musculoskeletal: Positive for neck and back pain. Neurological: Negative for dizziness, tremors, seizures, syncope, facial asymmetry, speech difficulty, weakness, light-headedness, numbness and headaches.  Hematological: Negative for adenopathy. Does not bruise/bleed easily. Psychiatric/Behavioral: Negative for hallucinations, behavioral problems, confusion, dysphoric mood, decreased concentration and agitation.   Objective:  BP 103/70 mmHg  Pulse 103  Temp(Src) 98.4 F (36.9 C)  Resp 13  Ht 5' 5.5" (1.664  m)  Wt 167 lb 12.8 oz (76.114 kg)  BMI 27.49 kg/m2  SpO2 100%  BP/Weight 02/03/2015 12/21/2014 12/05/2014  Systolic BP 103 120 113  Diastolic BP 70 79 83  Wt. (Lbs) 167.8 159.4 -  BMI 27.49 26.11 -   CMP Latest Ref Rng 12/21/2014 12/05/2014 07/08/2013  Glucose  65 - 99 mg/dL 161(W) 960(A) 94  BUN 7 - 25 mg/dL 4(L) <5(W) 8  Creatinine 0.50 - 1.10 mg/dL 0.98(J) 1.91(Y) 7.82  Sodium 135 - 146 mmol/L 132(L) 132(L) 128(L)  Potassium 3.5 - 5.3 mmol/L 4.9 4.0 4.0  Chloride 98 - 110 mmol/L 100 103 97  CO2 20 - 31 mmol/L 26 21(L) 19  Calcium 8.6 - 10.2 mg/dL 8.7 8.9 8.5  Total Protein 6.1 - 8.1 g/dL 6.6 7.6 -  Total Bilirubin 0.2 - 1.2 mg/dL 9.5(A) 2.0(H) -  Alkaline Phos 33 - 115 U/L 130(H) 155(H) -  AST 10 - 35 U/L 68(H) 75(H) -  ALT 6 - 29 U/L 23 24 -      Physical Exam Constitutional: Patient appears well-developed and well-nourished. No distress. HENT: Normocephalic, atraumatic, External right and left ear normal. Oropharynx is clear and moist.  Eyes: Conjunctivae and EOM are normal. PERRLA, no scleral icterus. Neck: Normal ROM. Neck supple. No JVD. No tracheal deviation. No thyromegaly. CVS: Tachycardic rate , S1/S2 +, no murmurs, no gallops, no carotid bruit.  Pulmonary: Effort and breath sounds normal, no stridor, rhonchi, wheezes, rales.  Abdominal: Umbilical hernia with ascites  Musculoskeletal: Normal range of motion. Bilateral pedal edema. Tenderness in lumbar spine, negative straight leg raise.  Lymphadenopathy: No lymphadenopathy noted, cervical, inguinal or axillary Neuro: Alert. Normal reflexes, muscle tone coordination. No cranial nerve deficit. Skin: spider naevi on anterior abdominal wall Psychiatric: Normal mood and affect. Behavior, judgment, thought content normal.   Assessment & Plan:   1. Diabetes mellitus screening - HgB A1c- 4.4, normal  2. Bilateral low back pain with right-sided sciatica Unable to place on tramadol due to hepatic insufficiency. - methocarbamol (ROBAXIN) 500 MG tablet; Take 1 tablet (500 mg total) by mouth every 8 (eight) hours as needed for muscle spasms.  Dispense: 90 tablet; Refill: 1  3. Alcoholic cirrhosis of liver with ascites [facility]) Patient will be needing a GI referral and has an  appointment with financial counselors today to obtain the [facility] discount which will facilitate this. Advised against using OTC liver supplements but the patient states that this helps out. Increasing dose of furosemide from 20 mg to 40 mg. We'll send off a basic metabolic panel and will also reassess at her next visit for the need for abdominal paracentesis. - furosemide (LASIX) 40 MG tablet; Take 1 tablet (40 mg total) by mouth daily.  Dispense: 30 tablet; Refill: 2 - Basic metabolic panel  4. Schizophrenia LCSW called in to see the patient as her pH Q 9 score of the extremely high. We have emphasized the importance of following up with mental health or [facility] for proper treatment.   Meds ordered this encounter  Medications  . furosemide (LASIX) 40 MG tablet    Sig: Take 1 tablet (40 mg total) by mouth daily.    Dispense:  30 tablet    Refill:  2    Discontinue previous dose  . methocarbamol (ROBAXIN) 500 MG tablet    Sig: Take 1 tablet (500 mg total) by mouth every 8 (eight) hours as needed for muscle spasms.    Dispense:  90 tablet  Refill:  1    Follow-up: Return in about 3 weeks (around 02/24/2015) for Follow-up of liver cirrhosis.   Jaclyn ShaggyEnobong Amao MD

## 2015-02-03 NOTE — Progress Notes (Signed)
Error, see duplicate encounter today

## 2015-02-03 NOTE — Patient Instructions (Addendum)
Cirrhosis Cirrhosis is long-term (chronic) liver injury. The liver is your largest internal organ, and it performs many functions. The liver converts food into energy, removes toxic material from your blood, makes important proteins, and absorbs necessary vitamins from your diet. If you have cirrhosis, it means many of your healthy liver cells have been replaced by scar tissue. This prevents blood from flowing through your liver, which makes it difficult for your liver to function. This scarring is not reversible, but treatment can prevent it from getting worse.  CAUSES  Hepatitis C and long-term alcohol abuse are the most common causes of cirrhosis. Other causes include:  Nonalcoholic fatty liver disease.  Hepatitis B infection.  Autoimmune hepatitis.  Diseases that cause blockage of ducts inside the liver.  Inherited liver diseases.  Reactions to certain long-term medicines.  Parasitic infections.  Long-term exposure to certain toxins. RISK FACTORS You may have a higher risk of cirrhosis if you:  Have certain hepatitis viruses.  Abuse alcohol, especially if you are female.  Are overweight.  Share needles.  Have unprotected sex with someone who has hepatitis. SYMPTOMS  You may not have any signs and symptoms at first. Symptoms may not develop until the damage to your liver starts to get worse. Signs and symptoms of cirrhosis may include:   Tenderness in the right-upper part of your abdomen.  Weakness and tiredness (fatigue).  Loss of appetite.  Nausea.  Weight loss and muscle loss.  Itchiness.  Yellow skin and eyes (jaundice).  Buildup of fluid in the abdomen (ascites).  Swelling of the feet and ankles (edema).  Appearance of tiny blood vessels under the skin.  Mental confusion.  Easy bruising and bleeding. DIAGNOSIS  Your health care provider may suspect cirrhosis based on your symptoms and medical history, especially if you have other medical conditions  or a history of alcohol abuse. Your health care provider will do a physical exam to feel your liver and check for signs of cirrhosis. Your health care provider may perform other tests, including:   Blood tests to check:   Whether you have hepatitis B or C.   Kidney function.  Liver function.  Imaging tests such as:  MRI or CT scan to look for changes seen in advanced cirrhosis.  Ultrasound to see if normal liver tissue is being replaced by scar tissue.  A procedure using a long needle to take a sample of liver tissue (biopsy) for examination under a microscope. Liver biopsy can confirm the diagnosis of cirrhosis.  TREATMENT  Treatment depends on how damaged your liver is and what caused the damage. Treatment may include treating cirrhosis symptoms or treating the underlying causes of the condition to try to slow the progression of the damage. Treatment may include:  Making lifestyle changes, such as:   Eating a healthy diet.  Restricting salt intake.  Maintaining a healthy weight.   Not abusing drugs or alcohol.  Taking medicines to:  Treat liver infections or other infections.  Control itching.  Reduce fluid buildup.  Reduce certain blood toxins.  Reduce risk of bleeding from enlarged blood vessels in the stomach or esophagus (varices).  If varices are causing bleeding problems, you may need treatment with a procedure that ties up the vessels causing them to fall off (band ligation).  If cirrhosis is causing your liver to fail, your health care provider may recommend a liver transplant.  Other treatments may be recommended depending on any complications of cirrhosis, such as liver-related kidney failure (hepatorenal  syndrome). HOME CARE INSTRUCTIONS   Take medicines only as directed by your health care provider. Do not use drugs that are toxic to your liver. Ask your health care provider before taking any new medicines, including over-the-counter medicines.    Rest as needed.  Eat a well-balanced diet. Ask your health care provider or dietitian for more information.   You may have to follow a low-salt diet or restrict your water intake as directed.  Do not drink alcohol. This is especially important if you are taking acetaminophen.  Keep all follow-up visits as directed by your health care provider. This is important. SEEK MEDICAL CARE IF:  You have fatigue or weakness that is getting worse.  You develop swelling of the hands, feet, legs, or face.  You have a fever.  You develop loss of appetite.  You have nausea or vomiting.  You develop jaundice.  You develop easy bruising or bleeding. SEEK IMMEDIATE MEDICAL CARE IF:  You vomit bright red blood or a material that looks like coffee grounds.  You have blood in your stools.  Your stools appear black and tarry.  You become confused.  You have chest pain or trouble breathing.   This information is not intended to replace advice given to you by your health care provider. Make sure you discuss any questions you have with your health care provider.   Document Released: 01/09/2005 Document Revised: 01/30/2014 Document Reviewed: 09/17/2013 Elsevier Interactive Patient Education 2016 Elsevier Inc. Back Pain, Adult Back pain is very common in adults.The cause of back pain is rarely dangerous and the pain often gets better over time.The cause of your back pain may not be known. Some common causes of back pain include:  Strain of the muscles or ligaments supporting the spine.  Wear and tear (degeneration) of the spinal disks.  Arthritis.  Direct injury to the back. For many people, back pain may return. Since back pain is rarely dangerous, most people can learn to manage this condition on their own. HOME CARE INSTRUCTIONS Watch your back pain for any changes. The following actions may help to lessen any discomfort you are feeling:  Remain active. It is stressful on your  back to sit or stand in one place for long periods of time. Do not sit, drive, or stand in one place for more than 30 minutes at a time. Take short walks on even surfaces as soon as you are able.Try to increase the length of time you walk each day.  Exercise regularly as directed by your health care provider. Exercise helps your back heal faster. It also helps avoid future injury by keeping your muscles strong and flexible.  Do not stay in bed.Resting more than 1-2 days can delay your recovery.  Pay attention to your body when you bend and lift. The most comfortable positions are those that put less stress on your recovering back. Always use proper lifting techniques, including:  Bending your knees.  Keeping the load close to your body.  Avoiding twisting.  Find a comfortable position to sleep. Use a firm mattress and lie on your side with your knees slightly bent. If you lie on your back, put a pillow under your knees.  Avoid feeling anxious or stressed.Stress increases muscle tension and can worsen back pain.It is important to recognize when you are anxious or stressed and learn ways to manage it, such as with exercise.  Take medicines only as directed by your health care provider. Over-the-counter medicines to reduce pain  and inflammation are often the most helpful.Your health care provider may prescribe muscle relaxant drugs.These medicines help dull your pain so you can more quickly return to your normal activities and healthy exercise.  Apply ice to the injured area:  Put ice in a plastic bag.  Place a towel between your skin and the bag.  Leave the ice on for 20 minutes, 2-3 times a day for the first 2-3 days. After that, ice and heat may be alternated to reduce pain and spasms.  Maintain a healthy weight. Excess weight puts extra stress on your back and makes it difficult to maintain good posture. SEEK MEDICAL CARE IF:  You have pain that is not relieved with rest or  medicine.  You have increasing pain going down into the legs or buttocks.  You have pain that does not improve in one week.  You have night pain.  You lose weight.  You have a fever or chills. SEEK IMMEDIATE MEDICAL CARE IF:   You develop new bowel or bladder control problems.  You have unusual weakness or numbness in your arms or legs.  You develop nausea or vomiting.  You develop abdominal pain.  You feel faint.   This information is not intended to replace advice given to you by your health care provider. Make sure you discuss any questions you have with your health care provider.   Document Released: 01/09/2005 Document Revised: 01/30/2014 Document Reviewed: 05/13/2013 Elsevier Interactive Patient Education [employer]

## 2015-02-03 NOTE — Patient Instructions (Signed)
PLAN:  1. F/U with behavioral health consultant in two weeks 2. Psychiatric Medications: Ativan, (other BH meds prescribed by [facility] unknown) 3. Behavioral recommendation(s):  -[facility] -Go to scheduled financial counseling appointment today -Consider [facility] inpatient alcohol treatment

## 2015-02-03 NOTE — Progress Notes (Signed)
ASSESSMENT: Pt currently experiencing untreated schizophrenia, [facility], as well as establish care with psychiatry [facility]. Pt may benefit from supportive counseling regarding coping with symptoms of schizophrenia. Stage of Change: precontemplative  PLAN: 1. F/U with behavioral health consultant in two weeks 2. Psychiatric Medications: Ativan, (unknown meds prescribed for schizophrenia) 3. Behavioral recommendation(s):   -[facility] -Go to scheduled financial counseling appointment -Consider [facility] inpatient treatment  SUBJECTIVE: Pt. referred by Dr Venetia NightAmao for uninsured psych referral/navigation:  Pt. reports the following symptoms/concerns: Pt states that her primary concerns are that [facility] is not [facility] (she does not like it there), she does not have insurance, and she has been hearing about [facility] treatment, and thinks she might like to go sometime.  Duration of problem: over one month Severity: severe  OBJECTIVE: Orientation & Cognition: Oriented x3. Thought processes normal and appropriate to situation. Mood: appropriate. Affect: appropriate Appearance: appropriate Risk of harm to self or others: no known risk of harm to self or others Substance use: tobacco Assessments administered: PHQ:24/ GAD7: 21  Diagnosis: Schizophrenia CPT Code: F20.89 -------------------------------------------- Other(s) present in the room: none  Time spent with patient in exam room: 12 minutes

## 2015-02-05 ENCOUNTER — Telehealth: Payer: Self-pay

## 2015-02-05 ENCOUNTER — Telehealth: Payer: Self-pay | Admitting: Family Medicine

## 2015-02-05 NOTE — Telephone Encounter (Signed)
Pt. Called requesting lab results. Please f/u with pt.  °

## 2015-02-05 NOTE — Telephone Encounter (Signed)
Patient returned my call at 5:15 pm. She verified her name and DOB. Patient was given her lab results with no further questions or concerns.

## 2015-02-05 NOTE — Telephone Encounter (Signed)
CMA called patient, patient didn't answer. Left a message for patient to return my call asap. 

## 2015-02-05 NOTE — Telephone Encounter (Signed)
-----   Message from Enobong Amao, MD sent at 02/04/2015 12:45 PM EST ----- Please inform the patient that labs are normal. Thank you. 

## 2015-02-08 NOTE — Telephone Encounter (Signed)
Patient name and date of birth verified and normal lab results given to patient.

## 2015-02-23 ENCOUNTER — Encounter: Payer: Self-pay | Admitting: Clinical

## 2015-02-23 DIAGNOSIS — F419 Anxiety disorder, unspecified: Principal | ICD-10-CM

## 2015-02-23 DIAGNOSIS — F329 Major depressive disorder, single episode, unspecified: Secondary | ICD-10-CM

## 2015-02-24 ENCOUNTER — Ambulatory Visit: Payer: Self-pay | Attending: Family Medicine | Admitting: Family Medicine

## 2015-02-24 ENCOUNTER — Encounter: Payer: Self-pay | Admitting: Family Medicine

## 2015-02-24 ENCOUNTER — Ambulatory Visit (HOSPITAL_BASED_OUTPATIENT_CLINIC_OR_DEPARTMENT_OTHER): Payer: Self-pay | Admitting: Clinical

## 2015-02-24 ENCOUNTER — Other Ambulatory Visit: Payer: Self-pay

## 2015-02-24 ENCOUNTER — Encounter: Payer: Self-pay | Admitting: Gastroenterology

## 2015-02-24 VITALS — BP 98/63 | HR 88 | Temp 98.2°F | Resp 16 | Ht 65.0 in | Wt 172.0 lb

## 2015-02-24 DIAGNOSIS — F2089 Other schizophrenia: Secondary | ICD-10-CM

## 2015-02-24 DIAGNOSIS — R0602 Shortness of breath: Secondary | ICD-10-CM | POA: Insufficient documentation

## 2015-02-24 DIAGNOSIS — K7031 Alcoholic cirrhosis of liver with ascites: Secondary | ICD-10-CM

## 2015-02-24 DIAGNOSIS — M7989 Other specified soft tissue disorders: Secondary | ICD-10-CM | POA: Insufficient documentation

## 2015-02-24 DIAGNOSIS — F329 Major depressive disorder, single episode, unspecified: Secondary | ICD-10-CM

## 2015-02-24 DIAGNOSIS — Z79899 Other long term (current) drug therapy: Secondary | ICD-10-CM | POA: Insufficient documentation

## 2015-02-24 DIAGNOSIS — F419 Anxiety disorder, unspecified: Principal | ICD-10-CM

## 2015-02-24 DIAGNOSIS — R6881 Early satiety: Secondary | ICD-10-CM | POA: Insufficient documentation

## 2015-02-24 DIAGNOSIS — F209 Schizophrenia, unspecified: Secondary | ICD-10-CM | POA: Insufficient documentation

## 2015-02-24 DIAGNOSIS — F418 Other specified anxiety disorders: Secondary | ICD-10-CM

## 2015-02-24 MED ORDER — FUROSEMIDE 40 MG PO TABS: 40.0000 mg | ORAL_TABLET | Freq: Every day | ORAL | Status: DC

## 2015-02-24 MED ORDER — PANTOPRAZOLE SODIUM 40 MG PO TBEC: 40.0000 mg | DELAYED_RELEASE_TABLET | Freq: Every day | ORAL | Status: DC

## 2015-02-24 MED ORDER — SPIRONOLACTONE 25 MG PO TABS: 25.0000 mg | ORAL_TABLET | Freq: Every day | ORAL | Status: DC

## 2015-02-24 NOTE — Progress Notes (Signed)
ASSESSMENT: Pt currently experiencing symptoms of anxiety and depression, needs to f/u with PCP and [facility], as well as establish care with psychiatry for [facility] med management; would benefit from case management and motivational interviewing regarding coping with symptoms of anxiety and depression.  Stage of Change: precontemplative  PLAN: 1. F/U with behavioral health consultant in two weeks 2. Psychiatric Medications: Ativan. 3. Behavioral recommendation(s):   -Go to [facility] walk-in clinic for [facility] med management -Go to social security office to apply for disability -Accept referral to [facility] uninsured program for case management  SUBJECTIVE: Pt. referred by Dr Venetia Night for symptoms of anxiety and depression Pt. reports the following symptoms/concerns: Pt stated that she has switched to non-alcoholic beer, is no longer drinking alcohol, she has not been to [place] yet, has been denied for disability previously(filed herself, without lawyer), and primary concern today is that she wants a home health aide, but is uninsured, and wants referral to dentist, smokes one pack cigarettes every 3 days, in pain (teeth and abdomen). Duration of problem: More than two months Severity: severe  OBJECTIVE: Orientation & Cognition: Oriented x3. Thought processes normal and appropriate to situation. Mood: appropriate. Affect: appropriate Appearance: appropriate Risk of harm to self or others: no known risk of harm to self or others Substance use: tobacco Assessments administered: PHQ9: 19/ GAD7: 16   Diagnosis: Anxiety and depression CPT Code: F41.8 -------------------------------------------- Other(s) present in the room: none  Time spent with patient in exam room: 25 minutes, 10: 05-10:30am

## 2015-02-24 NOTE — Progress Notes (Signed)
Patient's here for f/up liver cirrhosis.   Patient rates her pain at level 4/10 entire body.  Patient declines flu shot, but agreed to A1c screening.  Patient requesting refiil Lorazepam, furosemide.

## 2015-02-24 NOTE — Progress Notes (Signed)
Subjective:  Patient ID: Janet Tyler, female    DOB: 07-13-1965  Age: 50 y.o. MRN: [id number]  CC: Follow-up   HPI Janet Tyler . 50 year old female with a history of alcoholic liver cirrhosis with ascites, schizophrenia who comes in to the clinic for a follow-up visit. She complains of increased abdominal swelling and worsening shortness of breath along with pedal edema. She admits to running out of all her medications including her Lasix and spironolactone. Denies any hematemesis or moderate keys. But endorses bloating, early satiety.  She was referred to [facility] for management of mental health issues and is yet to see his psychiatrist there.  Outpatient Prescriptions Prior to Visit  Medication Sig Dispense Refill  . albuterol-ipratropium (COMBIVENT) 18-103 MCG/ACT inhaler Inhale 2 puffs into the lungs every 4 (four) hours as needed for wheezing or shortness of breath.    Marland Kitchen LORazepam (ATIVAN) 1 MG tablet Take 1 tablet twice a day as needed for 3 days then 1 tablet as needed for 2 days then stopped. 10 tablet 0  . methocarbamol (ROBAXIN) 500 MG tablet Take 1 tablet (500 mg total) by mouth every 8 (eight) hours as needed for muscle spasms. 90 tablet 1  . OVER THE COUNTER MEDICATION Take 1 tablet by mouth daily.    Marland Kitchen OVER THE COUNTER MEDICATION Take 2 tablets by mouth daily.    . furosemide (LASIX) 40 MG tablet Take 1 tablet (40 mg total) by mouth daily. 30 tablet 2  . spironolactone (ALDACTONE) 25 MG tablet Take 1 tablet (25 mg total) by mouth daily. 30 tablet 3  . folic acid (FOLVITE) 1 MG tablet Take 1 tablet (1 mg total) by mouth daily. (Patient not taking: Reported on 12/05/2014) 30 tablet 0  . nicotine (NICODERM CQ - DOSED IN MG/24 HOURS) 21 mg/24hr patch Place 1 patch (21 mg total) onto the skin daily. (Patient not taking: Reported on 12/05/2014) 28 patch 0  . pantoprazole (PROTONIX) 40 MG tablet Take 1 tablet (40 mg total) by mouth daily. (Patient not taking: Reported on  12/05/2014) 30 tablet 0   No facility-administered medications prior to visit.    ROS Review of Systems Constitutional: Negative for fever, chills, diaphoresis, activity change, appetite change and fatigue. Respiratory: Negative for cough, choking, chest tightness, positive for shortness of breath, negative for wheezing and stridor.  Cardiovascular: Negative for chest pain, palpitations and positive for leg swelling. Gastrointestinal: positive for abdominal distention and pain. Genitourinary: Negative for dysuria, urgency, frequency, hematuria, flank pain, decreased urine volume, difficulty urinating and dyspareunia.  Musculoskeletal: negative Neurological: Negative for dizziness, tremors, seizures, syncope, facial asymmetry, speech difficulty, weakness, light-headedness, numbness and headaches.  Hematological: Negative for adenopathy. Does not bruise/bleed easily. Psychiatric/Behavioral: Negative for hallucinations, behavioral problems, confusion, dysphoric mood, decreased concentration and agitation.   Objective:  BP 98/63 mmHg  Pulse 88  Temp(Src) 98.2 F (36.8 C) (Oral)  Resp 16  Ht  (1.651 m)  Wt 172 lb (78.019 kg)  BMI 28.62 kg/m2  SpO2 100%  BP/Weight 02/24/2015 02/03/2015 12/21/2014  Systolic BP 98 103 120  Diastolic BP 63 70 79  Wt. (Lbs) 172 167.8 159.4  BMI 28.62 27.49 26.11    CMP Latest Ref Rng 02/03/2015 12/21/2014 12/05/2014  Glucose 65 - 99 mg/dL 010(U) 725(D) 664(Q)  BUN 7 - 25 mg/dL 3(L) 4(L) <0(H)  Creatinine 0.50 - 1.10 mg/dL 4.74 2.59(D) 6.38(V)  Sodium 135 - 146 mmol/L 134(L) 132(L) 132(L)  Potassium 3.5 - 5.3 mmol/L 4.7 4.9 4.0  Chloride  98 - 110 mmol/L 106 100 103  CO2 20 - 31 mmol/L 23 26 21(L)  Calcium 8.6 - 10.2 mg/dL 0.9(W) 8.7 8.9  Total Protein 6.1 - 8.1 g/dL - 6.6 7.6  Total Bilirubin 0.2 - 1.2 mg/dL - 1.6(H) 2.0(H)  Alkaline Phos 33 - 115 U/L - 130(H) 155(H)  AST 10 - 35 U/L - 68(H) 75(H)  ALT 6 - 29 U/L - 23 24      Physical  Exam Constitutional: Patient appears well-developed and well-nourished. No distress. CVS: Tachycardic rate , S1/S2 +, no murmurs, no gallops, no carotid bruit.  Pulmonary: Effort and breath sounds normal, no stridor, rhonchi, wheezes, rales.  Abdominal: Umbilical hernia with ascites  Musculoskeletal: Normal range of motion. Bilateral pedal edema. Tenderness in lumbar spine, negative straight leg raise.  Lymphadenopathy: No lymphadenopathy noted, cervical, inguinal or axillary Neuro: Alert. Normal reflexes, muscle tone coordination. No cranial nerve deficit. Skin: spider naevi on anterior abdominal wall Psychiatric: Normal mood and affect. Behavior, judgment, thought content normal.   Assessment & Plan:   1. Alcoholic cirrhosis of liver with ascites (HCC) Scheduled for ultrasound-guided abdominal paracentesis with albumin infusion Referral to GI for optimization of management. - furosemide (LASIX) 40 MG tablet; Take 1 tablet (40 mg total) by mouth daily.  Dispense: 30 tablet; Refill: 2 - pantoprazole (PROTONIX) 40 MG tablet; Take 1 tablet (40 mg total) by mouth daily.  Dispense: 30 tablet; Refill: 0 - spironolactone (ALDACTONE) 25 MG tablet; Take 1 tablet (25 mg total) by mouth daily.  Dispense: 30 tablet; Refill: 3 - US Paracentesis; Future  2. Other schizophrenia [facility]) Patient strongly advised to follow-up with [facility] for optimization of management I have informed her that I will be unable to prescribe lorazepam as she needs to be treated for schizophrenia.   Meds ordered this encounter  Medications  . furosemide (LASIX) 40 MG tablet    Sig: Take 1 tablet (40 mg total) by mouth daily.    Dispense:  30 tablet    Refill:  2    Discontinue previous dose  . pantoprazole (PROTONIX) 40 MG tablet    Sig: Take 1 tablet (40 mg total) by mouth daily.    Dispense:  30 tablet    Refill:  0  . spironolactone (ALDACTONE) 25 MG tablet    Sig: Take 1 tablet (25 mg total) by mouth daily.     Dispense:  30 tablet    Refill:  3    Follow-up: Return in about 2 weeks (around 03/10/2015) for Follow-up on liver cirrhosis.   Jaclyn Shaggy MD

## 2015-02-24 NOTE — Patient Instructions (Signed)
Cirrhosis °Cirrhosis is long-term (chronic) liver injury. The liver is your largest internal organ, and it performs many functions. The liver converts food into energy, removes toxic material from your blood, makes important proteins, and absorbs necessary vitamins from your diet. °If you have cirrhosis, it means many of your healthy liver cells have been replaced by scar tissue. This prevents blood from flowing through your liver, which makes it difficult for your liver to function. This scarring is not reversible, but treatment can prevent it from getting worse.  °CAUSES  °Hepatitis C and long-term alcohol abuse are the most common causes of cirrhosis. Other causes include: °· Nonalcoholic fatty liver disease. °· Hepatitis B infection. °· Autoimmune hepatitis. °· Diseases that cause blockage of ducts inside the liver. °· Inherited liver diseases. °· Reactions to certain long-term medicines. °· Parasitic infections. °· Long-term exposure to certain toxins. °RISK FACTORS °You may have a higher risk of cirrhosis if you: °· Have certain hepatitis viruses. °· Abuse alcohol, especially if you are female. °· Are overweight. °· Share needles. °· Have unprotected sex with someone who has hepatitis. °SYMPTOMS  °You may not have any signs and symptoms at first. Symptoms may not develop until the damage to your liver starts to get worse. Signs and symptoms of cirrhosis may include:  °· Tenderness in the right-upper part of your abdomen. °· Weakness and tiredness (fatigue). °· Loss of appetite. °· Nausea. °· Weight loss and muscle loss. °· Itchiness. °· Yellow skin and eyes (jaundice). °· Buildup of fluid in the abdomen (ascites). °· Swelling of the feet and ankles (edema). °· Appearance of tiny blood vessels under the skin. °· Mental confusion. °· Easy bruising and bleeding. °DIAGNOSIS  °Your health care provider may suspect cirrhosis based on your symptoms and medical history, especially if you have other medical conditions  or a history of alcohol abuse. Your health care provider will do a physical exam to feel your liver and check for signs of cirrhosis. Your health care provider may perform other tests, including:  °· Blood tests to check:   °¨ Whether you have hepatitis B or C.   °¨ Kidney function. °¨ Liver function. °· Imaging tests such as: °¨ MRI or CT scan to look for changes seen in advanced cirrhosis. °¨ Ultrasound to see if normal liver tissue is being replaced by scar tissue. °· A procedure using a long needle to take a sample of liver tissue (biopsy) for examination under a microscope. Liver biopsy can confirm the diagnosis of cirrhosis.   °TREATMENT  °Treatment depends on how damaged your liver is and what caused the damage. Treatment may include treating cirrhosis symptoms or treating the underlying causes of the condition to try to slow the progression of the damage. Treatment may include: °· Making lifestyle changes, such as:   °¨ Eating a healthy diet. °¨ Restricting salt intake.  °¨ Maintaining a healthy weight.   °¨ Not abusing drugs or alcohol. °· Taking medicines to: °¨ Treat liver infections or other infections. °¨ Control itching. °¨ Reduce fluid buildup. °¨ Reduce certain blood toxins. °¨ Reduce risk of bleeding from enlarged blood vessels in the stomach or esophagus (varices). °· If varices are causing bleeding problems, you may need treatment with a procedure that ties up the vessels causing them to fall off (band ligation). °· If cirrhosis is causing your liver to fail, your health care provider may recommend a liver transplant. °· Other treatments may be recommended depending on any complications of cirrhosis, such as liver-related kidney failure (hepatorenal   syndrome). °HOME CARE INSTRUCTIONS  °· Take medicines only as directed by your health care provider. Do not use drugs that are toxic to your liver. Ask your health care provider before taking any new medicines, including over-the-counter medicines.    °· Rest as needed. °· Eat a well-balanced diet. Ask your health care provider or dietitian for more information.   °· You may have to follow a low-salt diet or restrict your water intake as directed. °· Do not drink alcohol. This is especially important if you are taking acetaminophen. °· Keep all follow-up visits as directed by your health care provider. This is important. °SEEK MEDICAL CARE IF: °· You have fatigue or weakness that is getting worse. °· You develop swelling of the hands, feet, legs, or face. °· You have a fever. °· You develop loss of appetite. °· You have nausea or vomiting. °· You develop jaundice. °· You develop easy bruising or bleeding. °SEEK IMMEDIATE MEDICAL CARE IF: °· You vomit bright red blood or a material that looks like coffee grounds. °· You have blood in your stools. °· Your stools appear black and tarry. °· You become confused. °· You have chest pain or trouble breathing. °  °This information is not intended to replace advice given to you by your health care provider. Make sure you discuss any questions you have with your health care provider. °  °Document Released: 01/09/2005 Document Revised: 01/30/2014 Document Reviewed: 09/17/2013 °Elsevier Interactive Patient Education ©2016 Elsevier Inc. ° °

## 2015-03-10 ENCOUNTER — Encounter: Payer: Self-pay | Admitting: Family Medicine

## 2015-03-10 ENCOUNTER — Ambulatory Visit: Payer: Self-pay | Attending: Family Medicine | Admitting: Family Medicine

## 2015-03-10 ENCOUNTER — Ambulatory Visit (HOSPITAL_COMMUNITY)
Admission: RE | Admit: 2015-03-10 | Discharge: 2015-03-10 | Disposition: A | Discharge status: Home / Self Care | Payer: Self-pay | Source: Ambulatory Visit | Attending: Family Medicine | Admitting: Family Medicine

## 2015-03-10 ENCOUNTER — Other Ambulatory Visit: Payer: Self-pay | Admitting: Family Medicine

## 2015-03-10 VITALS — BP 114/74 | HR 83 | Temp 97.6°F | Resp 15 | Ht 65.0 in | Wt 161.4 lb

## 2015-03-10 DIAGNOSIS — Z5189 Encounter for other specified aftercare: Secondary | ICD-10-CM | POA: Insufficient documentation

## 2015-03-10 DIAGNOSIS — F2089 Other schizophrenia: Secondary | ICD-10-CM

## 2015-03-10 DIAGNOSIS — K7031 Alcoholic cirrhosis of liver with ascites: Secondary | ICD-10-CM | POA: Insufficient documentation

## 2015-03-10 DIAGNOSIS — F209 Schizophrenia, unspecified: Secondary | ICD-10-CM | POA: Insufficient documentation

## 2015-03-10 DIAGNOSIS — Z9119 Patient's noncompliance with other medical treatment and regimen: Secondary | ICD-10-CM | POA: Insufficient documentation

## 2015-03-10 DIAGNOSIS — Z79899 Other long term (current) drug therapy: Secondary | ICD-10-CM | POA: Insufficient documentation

## 2015-03-10 DIAGNOSIS — L409 Psoriasis, unspecified: Secondary | ICD-10-CM | POA: Insufficient documentation

## 2015-03-10 LAB — CBC WITH DIFFERENTIAL/PLATELET
BASOS PCT: 1 % (ref 0–1)
Basophils Absolute: 0.1 10*3/uL (ref 0.0–0.1)
EOS PCT: 4 % (ref 0–5)
Eosinophils Absolute: 0.2 10*3/uL (ref 0.0–0.7)
HCT: 32.4 % — ABNORMAL LOW (ref 36.0–46.0)
HEMOGLOBIN: 11.4 g/dL — AB (ref 12.0–15.0)
Lymphocytes Relative: 27 % (ref 12–46)
Lymphs Abs: 1.6 10*3/uL (ref 0.7–4.0)
MCH: 33 pg (ref 26.0–34.0)
MCHC: 35.2 g/dL (ref 30.0–36.0)
MCV: 93.9 fL (ref 78.0–100.0)
MONO ABS: 1.1 10*3/uL — AB (ref 0.1–1.0)
MPV: 9.2 fL (ref 8.6–12.4)
Monocytes Relative: 18 % — ABNORMAL HIGH (ref 3–12)
NEUTROS ABS: 3 10*3/uL (ref 1.7–7.7)
Neutrophils Relative %: 50 % (ref 43–77)
PLATELETS: 153 10*3/uL (ref 150–400)
RBC: 3.45 MIL/uL — AB (ref 3.87–5.11)
RDW: 14.1 % (ref 11.5–15.5)
WBC: 5.9 10*3/uL (ref 4.0–10.5)

## 2015-03-10 LAB — COMPLETE METABOLIC PANEL WITH GFR
ALBUMIN: 2.7 g/dL — AB (ref 3.6–5.1)
ALK PHOS: 141 U/L — AB (ref 33–115)
ALT: 22 U/L (ref 6–29)
AST: 54 U/L — AB (ref 10–35)
BUN: 5 mg/dL — AB (ref 7–25)
CALCIUM: 9 mg/dL (ref 8.6–10.2)
CO2: 24 mmol/L (ref 20–31)
Chloride: 101 mmol/L (ref 98–110)
Creat: 0.48 mg/dL — ABNORMAL LOW (ref 0.50–1.10)
GFR, Est African American: >89 mL/min (ref >=60)
GFR, Est Non African American: >89 mL/min (ref >=60)
Glucose, Bld: 82 mg/dL (ref 65–99)
POTASSIUM: 4.9 mmol/L (ref 3.5–5.3)
Sodium: 131 mmol/L — ABNORMAL LOW (ref 135–146)
Total Bilirubin: 2.3 mg/dL — ABNORMAL HIGH (ref 0.2–1.2)
Total Protein: 7 g/dL (ref 6.1–8.1)

## 2015-03-10 LAB — POCT INR: INR: 1.4

## 2015-03-10 MED ORDER — LIDOCAINE HCL (PF) 1 % IJ SOLN
INTRAMUSCULAR | Status: AC
  Filled 2015-03-10: qty 10

## 2015-03-10 MED ORDER — FUROSEMIDE 20 MG PO TABS: 20.0000 mg | ORAL_TABLET | Freq: Every day | ORAL | Status: DC

## 2015-03-10 MED ORDER — TRIAMCINOLONE ACETONIDE 0.1 % EX CREA: 1.0000 "application " | TOPICAL_CREAM | Freq: Two times a day (BID) | CUTANEOUS | Status: DC

## 2015-03-10 MED ORDER — SPIRONOLACTONE 25 MG PO TABS: 25.0000 mg | ORAL_TABLET | Freq: Every day | ORAL | Status: DC

## 2015-03-10 MED ORDER — PANTOPRAZOLE SODIUM 40 MG PO TBEC: 40.0000 mg | DELAYED_RELEASE_TABLET | Freq: Every day | ORAL | Status: DC

## 2015-03-10 NOTE — Progress Notes (Signed)
Subjective:  Patient ID: Janet Tyler, female    DOB: 04-11-1965  Age: 50 y.o. MRN: [id number]  CC: Follow-up   HPI Janet Tyler is a 50 year old female with a history of alcoholic liver cirrhosis who comes into the clinic for a follow-up visit.  She is scheduled for ultrasound-guided paracentesis today; states her pedal edema has improved as well as her shortness of breath ever since her Lasix was doubled from 20 mg to 40 mg. Her daughter who accompanies her to the visit today complains that the mom had been hypertensive on one episode with a blood pressure dropping to 62/49 however the patient wants to remain on the 40 mg of Lasix since it improves her pedal edema. Abdomen is still distended and this is concerning for her.  She complains of a several month history of a bilateral lower extremity rash which is sometimes pruritic and she has no other rash in other body parts.  Outpatient Prescriptions Prior to Visit  Medication Sig Dispense Refill  . methocarbamol (ROBAXIN) 500 MG tablet Take 1 tablet (500 mg total) by mouth every 8 (eight) hours as needed for muscle spasms. 90 tablet 1  . Multiple Vitamins-Minerals (MULTIVITAMIN GUMMIES WOMENS PO) Take 2 capsules by mouth once.    Marland Kitchen OVER THE COUNTER MEDICATION Take 1 tablet by mouth daily.    Marland Kitchen OVER THE COUNTER MEDICATION Take 2 tablets by mouth daily.    . folic acid (FOLVITE) 1 MG tablet Take 1 tablet (1 mg total) by mouth daily. 30 tablet 0  . furosemide (LASIX) 40 MG tablet Take 1 tablet (40 mg total) by mouth daily. 30 tablet 2  . spironolactone (ALDACTONE) 25 MG tablet Take 1 tablet (25 mg total) by mouth daily. 30 tablet 3  . albuterol-ipratropium (COMBIVENT) 18-103 MCG/ACT inhaler Inhale 2 puffs into the lungs every 4 (four) hours as needed for wheezing or shortness of breath. Reported on 03/10/2015    . nicotine (NICODERM CQ - DOSED IN MG/24 HOURS) 21 mg/24hr patch Place 1 patch (21 mg total) onto the skin daily. (Patient not  taking: Reported on 12/05/2014) 28 patch 0  . pantoprazole (PROTONIX) 40 MG tablet Take 1 tablet (40 mg total) by mouth daily. (Patient not taking: Reported on 03/10/2015) 30 tablet 1   No facility-administered medications prior to visit.    ROS Review of Systems  Constitutional: Negative for activity change, appetite change and fatigue.  HENT: Negative for congestion, sinus pressure and sore throat.   Eyes: Negative for visual disturbance.  Respiratory: Negative for cough, chest tightness, shortness of breath and wheezing.   Cardiovascular: Positive for leg swelling. Negative for chest pain and palpitations.  Gastrointestinal: Positive for abdominal distention. Negative for abdominal pain and constipation.  Endocrine: Negative for polydipsia.  Genitourinary: Negative for dysuria and frequency.  Musculoskeletal: Negative for back pain and arthralgias.  Skin: Positive for rash.  Neurological: Negative for tremors, light-headedness and numbness.  Hematological: Does not bruise/bleed easily.  Psychiatric/Behavioral: Negative for behavioral problems and agitation.    Objective:  BP 114/74 mmHg  Pulse 83  Temp(Src) 97.6 F (36.4 C)  Resp 15  Ht  (1.651 m)  Wt 161 lb 6.4 oz (73.211 kg)  BMI 26.86 kg/m2  SpO2 100%  BP/Weight 03/10/2015 02/24/2015 02/03/2015  Systolic BP 114 98 103  Diastolic BP 74 63 70  Wt. (Lbs) 161.4 172 167.8  BMI 26.86 28.62 27.49      Physical Exam Constitutional: Patient appears well-developed and well-nourished. No distress.  CVS: Tachycardic rate , S1/S2 +, no murmurs, no gallops, no carotid bruit.  Pulmonary: Effort and breath sounds normal, no stridor, rhonchi, wheezes, rales.  Abdominal: Umbilical hernia with ascites  Musculoskeletal: Normal range of motion. Bilateral 1+pedal edema. Lymphadenopathy: No lymphadenopathy noted, cervical, inguinal or axillary Neuro: Alert. Normal reflexes, muscle tone coordination. No cranial nerve deficit. Skin:  spider naevi on anterior abdominal wall; few raised erythematous plaques with a silver coating on few of the rash on bilateral shins  Psychiatric: Normal mood and affect. Behavior, judgment, thought content normal.   Assessment & Plan:   1. Alcoholic cirrhosis of liver with ascites (HCC) Scheduled for ultrasound-guided paracentesis today Reduce Lasix from 40 mg to 20 mg due to hypotensive episodes at home. She has been advised to monitor her blood pressures at home. - COMPLETE METABOLIC PANEL WITH GFR - CBC with Differential/Platelet - INR - furosemide (LASIX) 20 MG tablet; Take 1 tablet (20 mg total) by mouth daily. Take 1 extra pill with severe pedal edema.  Dispense: 40 tablet; Refill: 2 - pantoprazole (PROTONIX) 40 MG tablet; Take 1 tablet (40 mg total) by mouth daily.  Dispense: 30 tablet; Refill: 1 - spironolactone (ALDACTONE) 25 MG tablet; Take 1 tablet (25 mg total) by mouth daily.  Dispense: 30 tablet; Refill: 3  2. Psoriasis Advised patient to use topical steroids sparingly - triamcinolone cream (KENALOG) 0.1 %; Apply 1 application topically 2 (two) times daily.  Dispense: 30 g; Refill: 0  3. Other schizophrenia [facility]) Patient has not been compliant with mental health appointments. LCSW called in to speak with the patient  Medications sent to the pharmacy in house and I have explained to the patient that this will be more cost effective for her and she is agreeable.  Meds ordered this encounter  Medications  . triamcinolone cream (KENALOG) 0.1 %    Sig: Apply 1 application topically 2 (two) times daily.    Dispense:  30 g    Refill:  0  . furosemide (LASIX) 20 MG tablet    Sig: Take 1 tablet (20 mg total) by mouth daily. Take 1 extra pill with severe pedal edema.    Dispense:  40 tablet    Refill:  2    Discontinue previous dose  . pantoprazole (PROTONIX) 40 MG tablet    Sig: Take 1 tablet (40 mg total) by mouth daily.    Dispense:  30 tablet    Refill:  1  .  spironolactone (ALDACTONE) 25 MG tablet    Sig: Take 1 tablet (25 mg total) by mouth daily.    Dispense:  30 tablet    Refill:  3    Follow-up: Return in about 6 weeks (around 04/21/2015) for Follow-up of liver cirrhosis.   Jaclyn Shaggy MD

## 2015-03-10 NOTE — Patient Instructions (Signed)
Cirrhosis °Cirrhosis is long-term (chronic) liver injury. The liver is your largest internal organ, and it performs many functions. The liver converts food into energy, removes toxic material from your blood, makes important proteins, and absorbs necessary vitamins from your diet. °If you have cirrhosis, it means many of your healthy liver cells have been replaced by scar tissue. This prevents blood from flowing through your liver, which makes it difficult for your liver to function. This scarring is not reversible, but treatment can prevent it from getting worse.  °CAUSES  °Hepatitis C and long-term alcohol abuse are the most common causes of cirrhosis. Other causes include: °· Nonalcoholic fatty liver disease. °· Hepatitis B infection. °· Autoimmune hepatitis. °· Diseases that cause blockage of ducts inside the liver. °· Inherited liver diseases. °· Reactions to certain long-term medicines. °· Parasitic infections. °· Long-term exposure to certain toxins. °RISK FACTORS °You may have a higher risk of cirrhosis if you: °· Have certain hepatitis viruses. °· Abuse alcohol, especially if you are female. °· Are overweight. °· Share needles. °· Have unprotected sex with someone who has hepatitis. °SYMPTOMS  °You may not have any signs and symptoms at first. Symptoms may not develop until the damage to your liver starts to get worse. Signs and symptoms of cirrhosis may include:  °· Tenderness in the right-upper part of your abdomen. °· Weakness and tiredness (fatigue). °· Loss of appetite. °· Nausea. °· Weight loss and muscle loss. °· Itchiness. °· Yellow skin and eyes (jaundice). °· Buildup of fluid in the abdomen (ascites). °· Swelling of the feet and ankles (edema). °· Appearance of tiny blood vessels under the skin. °· Mental confusion. °· Easy bruising and bleeding. °DIAGNOSIS  °Your health care provider may suspect cirrhosis based on your symptoms and medical history, especially if you have other medical conditions  or a history of alcohol abuse. Your health care provider will do a physical exam to feel your liver and check for signs of cirrhosis. Your health care provider may perform other tests, including:  °· Blood tests to check:   °¨ Whether you have hepatitis B or C.   °¨ Kidney function. °¨ Liver function. °· Imaging tests such as: °¨ MRI or CT scan to look for changes seen in advanced cirrhosis. °¨ Ultrasound to see if normal liver tissue is being replaced by scar tissue. °· A procedure using a long needle to take a sample of liver tissue (biopsy) for examination under a microscope. Liver biopsy can confirm the diagnosis of cirrhosis.   °TREATMENT  °Treatment depends on how damaged your liver is and what caused the damage. Treatment may include treating cirrhosis symptoms or treating the underlying causes of the condition to try to slow the progression of the damage. Treatment may include: °· Making lifestyle changes, such as:   °¨ Eating a healthy diet. °¨ Restricting salt intake.  °¨ Maintaining a healthy weight.   °¨ Not abusing drugs or alcohol. °· Taking medicines to: °¨ Treat liver infections or other infections. °¨ Control itching. °¨ Reduce fluid buildup. °¨ Reduce certain blood toxins. °¨ Reduce risk of bleeding from enlarged blood vessels in the stomach or esophagus (varices). °· If varices are causing bleeding problems, you may need treatment with a procedure that ties up the vessels causing them to fall off (band ligation). °· If cirrhosis is causing your liver to fail, your health care provider may recommend a liver transplant. °· Other treatments may be recommended depending on any complications of cirrhosis, such as liver-related kidney failure (hepatorenal   syndrome). °HOME CARE INSTRUCTIONS  °· Take medicines only as directed by your health care provider. Do not use drugs that are toxic to your liver. Ask your health care provider before taking any new medicines, including over-the-counter medicines.    °· Rest as needed. °· Eat a well-balanced diet. Ask your health care provider or dietitian for more information.   °· You may have to follow a low-salt diet or restrict your water intake as directed. °· Do not drink alcohol. This is especially important if you are taking acetaminophen. °· Keep all follow-up visits as directed by your health care provider. This is important. °SEEK MEDICAL CARE IF: °· You have fatigue or weakness that is getting worse. °· You develop swelling of the hands, feet, legs, or face. °· You have a fever. °· You develop loss of appetite. °· You have nausea or vomiting. °· You develop jaundice. °· You develop easy bruising or bleeding. °SEEK IMMEDIATE MEDICAL CARE IF: °· You vomit bright red blood or a material that looks like coffee grounds. °· You have blood in your stools. °· Your stools appear black and tarry. °· You become confused. °· You have chest pain or trouble breathing. °  °This information is not intended to replace advice given to you by your health care provider. Make sure you discuss any questions you have with your health care provider. °  °Document Released: 01/09/2005 Document Revised: 01/30/2014 Document Reviewed: 09/17/2013 °Elsevier Interactive Patient Education ©2016 Elsevier Inc. ° °

## 2015-03-10 NOTE — Progress Notes (Signed)
Patient states she is drinking 10 non-alcoholic beers per day and smoking less than half pack cigarettes per day She does not use her inhaler because she thinks it causes further liver damage She could not afford her pantoprazole Reports severe weakness and malaise at home

## 2015-03-11 ENCOUNTER — Telehealth: Payer: Self-pay | Admitting: *Deleted

## 2015-03-11 NOTE — Telephone Encounter (Signed)
HIPAA compliant message left to return call

## 2015-03-11 NOTE — Telephone Encounter (Signed)
-----   Message from Jaclyn Shaggy, MD sent at 03/11/2015  1:09 PM EST ----- Sodium is low, liver enzymes are mildly elevated- a picture of liver cirrhosis; labs are stable compared to prior labs.

## 2015-03-15 ENCOUNTER — Telehealth: Payer: Self-pay | Admitting: Family Medicine

## 2015-03-15 NOTE — Telephone Encounter (Signed)
Pt. Called to get results from labs....please follow up with patient

## 2015-03-15 NOTE — Telephone Encounter (Signed)
Verified name and date of birth and gave results 

## 2015-03-16 NOTE — Telephone Encounter (Signed)
Lab results given to patient and she verbalized understanding.

## 2015-03-17 ENCOUNTER — Encounter (HOSPITAL_COMMUNITY): Payer: Self-pay | Admitting: Emergency Medicine

## 2015-03-17 ENCOUNTER — Emergency Department (INDEPENDENT_AMBULATORY_CARE_PROVIDER_SITE_OTHER): Payer: No Typology Code available for payment source

## 2015-03-17 ENCOUNTER — Emergency Department (INDEPENDENT_AMBULATORY_CARE_PROVIDER_SITE_OTHER)
Admission: EM | Admit: 2015-03-17 | Discharge: 2015-03-17 | Disposition: A | Discharge status: Home / Self Care | Payer: No Typology Code available for payment source | Source: Home / Self Care | Attending: Family Medicine | Admitting: Family Medicine

## 2015-03-17 DIAGNOSIS — J410 Simple chronic bronchitis: Secondary | ICD-10-CM

## 2015-03-17 DIAGNOSIS — J40 Bronchitis, not specified as acute or chronic: Principal | ICD-10-CM

## 2015-03-17 DIAGNOSIS — Z720 Tobacco use: Secondary | ICD-10-CM

## 2015-03-17 MED ORDER — LEVOFLOXACIN 500 MG PO TABS: 500.0000 mg | ORAL_TABLET | Freq: Every day | ORAL | Status: DC

## 2015-03-17 MED ORDER — GUAIFENESIN-CODEINE 100-10 MG/5ML PO SYRP: 10.0000 mL | ORAL_SOLUTION | Freq: Four times a day (QID) | ORAL | Status: DC | PRN

## 2015-03-17 MED ORDER — ALBUTEROL SULFATE HFA 108 (90 BASE) MCG/ACT IN AERS: 2.0000 | INHALATION_SPRAY | Freq: Four times a day (QID) | RESPIRATORY_TRACT | Status: DC | PRN

## 2015-03-17 NOTE — ED Provider Notes (Addendum)
CSN: [id number]     Arrival date & time 03/17/15  1410 History   First MD Initiated Contact with Patient 03/17/15 1551     Chief Complaint  Patient presents with  . URI  . Cough   (Consider location/radiation/quality/duration/timing/severity/associated sxs/prior Treatment) Patient is a 50 y.o. female presenting with URI and cough. The history is provided by the patient.  URI Presenting symptoms: congestion, cough and fever   Severity:  Moderate Onset quality:  Gradual Duration:  2 days Progression:  Worsening Chronicity:  New Relieved by:  None tried Worsened by:  Nothing tried Ineffective treatments:  None tried Associated symptoms: no wheezing   Cough Associated symptoms: chills and fever   Associated symptoms: no chest pain, no shortness of breath and no wheezing     Past Medical History  Diagnosis Date  . Anxiety   . Depression   . Scoliosis   . Cirrhosis (HCC)   . Substance abuse    History reviewed. No pertinent past surgical history. Family History  Problem Relation Age of Onset  . Arthritis Mother   . Heart disease Mother   . Hypertension Mother   . Stroke Mother    Social History  Substance Use Topics  . Smoking status: Current Every Day Smoker -- 0.50 packs/day    Types: Cigarettes  . Smokeless tobacco: Never Used  . Alcohol Use: No     Comment: last drink 12/05/14   OB History    No data available     Review of Systems  Constitutional: Positive for fever and chills. Negative for activity change and appetite change.  HENT: Positive for congestion and postnasal drip.   Respiratory: Positive for cough. Negative for shortness of breath and wheezing.   Cardiovascular: Negative for chest pain, palpitations and leg swelling.  All other systems reviewed and are negative.   Allergies  Review of patient's allergies indicates no known allergies.  Home Medications   Prior to Admission medications   Medication Sig Start Date End Date Taking?  Authorizing Provider  albuterol-ipratropium (COMBIVENT) 18-103 MCG/ACT inhaler Inhale 2 puffs into the lungs every 4 (four) hours as needed for wheezing or shortness of breath. Reported on 03/10/2015    Historical Provider, MD  furosemide (LASIX) 20 MG tablet Take 1 tablet (20 mg total) by mouth daily. Take 1 extra pill with severe pedal edema. 03/10/15   Jaclyn Shaggy, MD  guaiFENesin-codeine (ROBITUSSIN AC) 100-10 MG/5ML syrup Take 10 mLs by mouth 4 (four) times daily as needed for cough. 03/17/15   Linna Hoff, MD  levofloxacin (LEVAQUIN) 500 MG tablet Take 1 tablet (500 mg total) by mouth daily. 03/17/15   Linna Hoff, MD  methocarbamol (ROBAXIN) 500 MG tablet Take 1 tablet (500 mg total) by mouth every 8 (eight) hours as needed for muscle spasms. 02/03/15   Jaclyn Shaggy, MD  Multiple Vitamins-Minerals (MULTIVITAMIN GUMMIES WOMENS PO) Take 2 capsules by mouth once.    Historical Provider, MD  OVER THE COUNTER MEDICATION Take 1 tablet by mouth daily.    Historical Provider, MD  OVER THE COUNTER MEDICATION Take 2 tablets by mouth daily.    Historical Provider, MD  pantoprazole (PROTONIX) 40 MG tablet Take 1 tablet (40 mg total) by mouth daily. 03/10/15   Jaclyn Shaggy, MD  spironolactone (ALDACTONE) 25 MG tablet Take 1 tablet (25 mg total) by mouth daily. 03/10/15   Jaclyn Shaggy, MD  triamcinolone cream (KENALOG) 0.1 % Apply 1 application topically 2 (two) times daily. 03/10/15  Jaclyn Shaggy, MD   Meds Ordered and Administered this Visit  Medications - No data to display  BP 97/57 mmHg  Pulse 84  Temp(Src) 98.7 F (37.1 C) (Oral)  Resp 16  SpO2 100% No data found.   Physical Exam  Constitutional: She is oriented to person, place, and time. She appears well-developed and well-nourished.  HENT:  Right Ear: External ear normal.  Left Ear: External ear normal.  Mouth/Throat: Oropharynx is clear and moist.  Neck: Normal range of motion. Neck supple.  Cardiovascular: Normal rate, regular  rhythm, normal heart sounds and intact distal pulses.   Pulmonary/Chest: Effort normal. She has decreased breath sounds. She has rhonchi.  Lymphadenopathy:    She has no cervical adenopathy.  Neurological: She is alert and oriented to person, place, and time.  Skin: Skin is warm and dry.  Nursing note and vitals reviewed.   ED Course  Procedures (including critical care time)  Labs Review Labs Reviewed - No data to display  Imaging Review Dg Chest 2 View  03/17/2015  CLINICAL DATA:  Cough with fever for 3 days EXAM: CHEST  2 VIEW COMPARISON:  December 05, 2014 FINDINGS: There is mild bibasilar scarring. There is no appreciable edema or consolidation. The heart size and pulmonary vascularity are normal. No adenopathy. There is evidence of old healed rib fractures on the right. IMPRESSION: Mild bibasilar lung scarring.  No edema or consolidation. Electronically Signed   By: Bretta Bang III M.D.   On: 03/17/2015 16:11   X-rays reviewed and report per radiologist.   Visual Acuity Review  Right Eye Distance:   Left Eye Distance:   Bilateral Distance:    Right Eye Near:   Left Eye Near:    Bilateral Near:         MDM   1. Bronchitis due to tobacco use (HCC)    Meds ordered this encounter  Medications  . levofloxacin (LEVAQUIN) 500 MG tablet    Sig: Take 1 tablet (500 mg total) by mouth daily.    Dispense:  7 tablet    Refill:  0  . guaiFENesin-codeine (ROBITUSSIN AC) 100-10 MG/5ML syrup    Sig: Take 10 mLs by mouth 4 (four) times daily as needed for cough.    Dispense:  180 mL    Refill:  0       Linna Hoff, MD 03/17/15 1630  Linna Hoff, MD 03/17/15 [phone]

## 2015-03-17 NOTE — ED Notes (Signed)
Coughing up blood, dark per family report

## 2015-03-17 NOTE — ED Notes (Signed)
Staff has seen this patient and her family members arguing in parking lot.  Daughter tearful in lobby.  Patient does not admit to any arguing.  Son reporting observations, patient either denying or has a different reason for symptoms.  Patient reports blood is coming form her teeth for a month.  Son says blood noticed today and not from teeth.

## 2015-03-17 NOTE — ED Notes (Signed)
Cough, aches and pains, sob, low grade fever-101 per patient.  Symptoms started 2 days ago.  Reports asthma exacerbation.  Last used inhaler 20 minutes ago.  Auscultated breath sounds -no wheezing.

## 2015-04-02 MED FILL — FUROSEMIDE 20 MG TABLET: 20 | 20 days supply | Qty: 40 | Fill #0

## 2015-04-02 MED FILL — ?PANTOPRAZOLE SOD DR 40MG: 40 MG | 30 days supply | Qty: 30 | Fill #0

## 2015-04-02 MED FILL — TRIAMCINOLONE 0.1% CREAM: 0.1 | 15 days supply | Qty: 30 | Fill #0

## 2015-04-02 MED FILL — ?SPIRONOLACTONE 25 MG TABLE: 25 | 30 days supply | Qty: 30 | Fill #0

## 2015-04-21 ENCOUNTER — Encounter: Payer: Self-pay | Admitting: Gastroenterology

## 2015-04-21 ENCOUNTER — Ambulatory Visit (INDEPENDENT_AMBULATORY_CARE_PROVIDER_SITE_OTHER): Payer: No Typology Code available for payment source | Admitting: Gastroenterology

## 2015-04-21 ENCOUNTER — Other Ambulatory Visit: Payer: Self-pay

## 2015-04-21 ENCOUNTER — Other Ambulatory Visit: Payer: No Typology Code available for payment source

## 2015-04-21 VITALS — BP 94/58 | HR 101 | Ht 65.0 in | Wt 161.2 lb

## 2015-04-21 DIAGNOSIS — R140 Abdominal distension (gaseous): Secondary | ICD-10-CM

## 2015-04-21 DIAGNOSIS — I9589 Other hypotension: Secondary | ICD-10-CM

## 2015-04-21 DIAGNOSIS — E871 Hypo-osmolality and hyponatremia: Secondary | ICD-10-CM

## 2015-04-21 DIAGNOSIS — K7031 Alcoholic cirrhosis of liver with ascites: Secondary | ICD-10-CM

## 2015-04-21 DIAGNOSIS — K7030 Alcoholic cirrhosis of liver without ascites: Secondary | ICD-10-CM

## 2015-04-21 NOTE — Progress Notes (Signed)
[facility] Consult Note:  History: Charlesetta IvoryDeana Schwenke 04/21/2015  Referring physician: Jaclyn ShaggyEnobong, Amao, MD  Reason for consult/chief complaint: Cirrhosis   Subjective HPI:  Jasira was referred to the local health clinic for evaluation of cirrhosis with ascites. She was a heavy alcohol user until November 2016, when she quit on her birthday. She reports being hospitalized for fluid overload. Her recent PCP visit indicates she has only tolerated low-dose diuretics because of hypotension. At that visit her systolic blood pressure was 60. Therefore, her Lasix dose was decreased from 40-20 mg, spironolactone dose was Stable at 25 mg. Kimmi complains of market abdominal distention as well as an umbilical hernia, peripheral edema that becomes severe by the end of the day as well as bruising on her legs and varicose veins. She denies being lightheaded or having episodes of syncope. She has a chronic dry cough and unfortunately continues to smoke cigarettes. She has never had an upper GI bleed ROS:  Review of Systems  Constitutional: Negative for appetite change and unexpected weight change.  HENT: Positive for nosebleeds. Negative for mouth sores and voice change.   Eyes: Negative for pain and redness.  Respiratory: Positive for cough. Negative for shortness of breath.   Cardiovascular: Negative for chest pain and palpitations.  Genitourinary: Negative for dysuria and hematuria.  Musculoskeletal: Positive for myalgias. Negative for arthralgias.  Skin: Negative for pallor and rash.  Neurological: Negative for weakness and headaches.  Hematological: Negative for adenopathy.  Psychiatric/Behavioral: Positive for dysphoric mood. The patient is nervous/anxious.    also urinary leakage Past Medical History: Past Medical History  Diagnosis Date  . Anxiety   . Depression   . Scoliosis   . Cirrhosis (HCC)   . Substance abuse      Past Surgical History: History reviewed. No pertinent  past surgical history. No prior surgery  Family History: Family History  Problem Relation Age of Onset  . Arthritis Mother   . Heart disease Mother   . Hypertension Mother   . Stroke Mother     Social History: Social History   Social History  . Marital Status: Single    Spouse Name: N/A  . Number of Children: N/A  . Years of Education: N/A   Social History Main Topics  . Smoking status: Current Every Day Smoker -- 0.50 packs/day    Types: Cigarettes  . Smokeless tobacco: Never Used  . Alcohol Use: No     Comment: last drink 12/05/14  . Drug Use: No  . Sexual Activity: Not Asked   Other Topics Concern  . None   Social History Narrative    Allergies: No Known Allergies  Outpatient Meds: Current Outpatient Prescriptions  Medication Sig Dispense Refill  . albuterol (PROVENTIL HFA;VENTOLIN HFA) 108 (90 Base) MCG/ACT inhaler Inhale 2 puffs into the lungs every 6 (six) hours as needed for wheezing or shortness of breath. 1 Inhaler 0  . albuterol-ipratropium (COMBIVENT) 18-103 MCG/ACT inhaler Inhale 2 puffs into the lungs every 4 (four) hours as needed for wheezing or shortness of breath. Reported on 03/10/2015    . furosemide (LASIX) 20 MG tablet Take 1 tablet (20 mg total) by mouth daily. Take 1 extra pill with severe pedal edema. 40 tablet 2  . guaiFENesin-codeine (ROBITUSSIN AC) 100-10 MG/5ML syrup Take 10 mLs by mouth 4 (four) times daily as needed for cough. 180 mL 0  . levofloxacin (LEVAQUIN) 500 MG tablet Take 1 tablet (500 mg total) by mouth daily. 7 tablet 0  . methocarbamol (  ROBAXIN) 500 MG tablet Take 1 tablet (500 mg total) by mouth every 8 (eight) hours as needed for muscle spasms. 90 tablet 1  . Multiple Vitamins-Minerals (MULTIVITAMIN GUMMIES WOMENS PO) Take 2 capsules by mouth once.    Marland Kitchen. OVER THE COUNTER MEDICATION Take 1 tablet by mouth daily.    Marland Kitchen. OVER THE COUNTER MEDICATION Take 2 tablets by mouth daily.    . pantoprazole (PROTONIX) 40 MG tablet Take 1  tablet (40 mg total) by mouth daily. 30 tablet 1  . spironolactone (ALDACTONE) 25 MG tablet Take 1 tablet (25 mg total) by mouth daily. 30 tablet 3  . triamcinolone cream (KENALOG) 0.1 % Apply 1 application topically 2 (two) times daily. 30 g 0   No current facility-administered medications for this visit.      ___________________________________________________________________ Objective  Exam:  BP 94/58 mmHg  Pulse 101  Ht 5\' 5"  (1.651 m)  Wt 161 lb 3.2 oz (73.12 kg)  BMI 26.83 kg/m2  SpO2 98%   General: this is a(n) Chronically ill-appearing white woman who looks older than stated age, decreased generalized muscle mass   Eyes: sclera anicteric, no redness  ENT: oral mucosa moist without lesions, no cervical or supraclavicular lymphadenopathy, poor dentition  CV: RRR without murmur, S1/S2, no JVD, 1+ pretibial edema bilaterally, varicose veins below the knee bilaterally as well as venous stasis changes  Resp: clear to auscultation bilaterally, normal RR and effort noted  GI: soft, mild epigastric tenderness, with active bowel sounds. Enlarged left hepatic lobe noted. Bulging flanks, no shifting dullness, small reducible umbilical hernia  Skin; warm and dry, no rash or jaundice noted  Neuro: awake, alert and oriented x 3. Normal gross motor function and fluent speech, no asterixis  Gait is steady, speech fluent  Labs:  CMP Latest Ref Rng 03/10/2015 02/03/2015 12/21/2014  Glucose 65 - 99 mg/dL 82 161(W106(H) 960(A109(H)  BUN 7 - 25 mg/dL 5(L) 3(L) 4(L)  Creatinine 0.50 - 1.10 mg/dL 5.40(J0.48(L) 8.110.51 9.14(N0.45(L)  Sodium 135 - 146 mmol/L 131(L) 134(L) 132(L)  Potassium 3.5 - 5.3 mmol/L 4.9 4.7 4.9  Chloride 98 - 110 mmol/L 101 106 100  CO2 20 - 31 mmol/L 24 23 26   Calcium 8.6 - 10.2 mg/dL 9.0 8.2(N8.4(L) 8.7  Total Protein 6.1 - 8.1 g/dL 7.0 - 6.6  Total Bilirubin 0.2 - 1.2 mg/dL 2.3(H) - 1.6(H)  Alkaline Phos 33 - 115 U/L 141(H) - 130(H)  AST 10 - 35 U/L 54(H) - 68(H)  ALT 6 - 29 U/L 22 -  23    Albumin 2.7  Lab Results  Component Value Date   WBC 5.9 03/10/2015   HGB 11.4* 03/10/2015   HCT 32.4* 03/10/2015   MCV 93.9 03/10/2015   PLT 153 03/10/2015   inr 1.4  MELD = 18, including sodium factor  Hepatitis ABC panel in June 2015 was negative  Radiologic Studies:  Recent Abd U/S - no pocket of fluid for tap  Assessment: Encounter Diagnoses  Name Primary?  . Alcoholic cirrhosis of liver with ascites (HCC) Yes  . Hyponatremia   . Abdominal distension   . Other specified hypotension     This patient has advanced liver disease, with a combination of sodium/fluid overload that is managed on low-dose diuretics, which is fortunate since her blood pressure is low. No current evidence of hepatic encephalopathy It is unknown if she has esophageal or gastric varices  Despite her ME LD score, she is not currently a candidate for liver transplant evaluation  since she has not yet 6 months abstinent, and also needs to undergo formal alcohol rehabilitation. It is not clear to me that she has a robust social support network.  Plan: Alpha-fetoprotein Written materials given for low sodium diet. I stressed the importance of this per CT scan abdomen and pelvis with contrast given her abdominal distention and complaints of fluid overload despite lack of fluid collection seen on ultrasound. I suspect she has a lot of fluid in the mesentery that cannot be tapped EGD to screen for esophageal varices  The benefits and risks of the planned procedure were described in detail with the patient or (when appropriate) their health care proxy.  Risks were outlined as including, but not limited to, bleeding, infection, perforation, adverse medication reaction leading to cardiac or pulmonary decompensation, or pancreatitis (if ERCP).  The limitation of incomplete mucosal visualization was also discussed.  No guarantees or warranties were given.  A follow-up visit she will need Twinrix  She  is also encouraged in annual flu vaccine, and primary care should consider giving her a Pneumovax, which is recommended for all patients with advanced cirrhosis.  Thank you for the courtesy of this consult.  Please call me with any questions or concerns.  Charlie Pitter III

## 2015-04-21 NOTE — Patient Instructions (Signed)
You have been scheduled for a CT scan of the abdomen and pelvis at [facility] ([address]---this is in the same building as [facility]).   You are scheduled on 04-27-2015 at 9:30am. You should arrive 15 minutes prior to your appointment time for registration. Please follow the written instructions below on the day of your exam:  WARNING: IF YOU ARE ALLERGIC TO IODINE/X-RAY DYE, PLEASE NOTIFY RADIOLOGY IMMEDIATELY AT [phone]! YOU WILL BE GIVEN A 13 HOUR PREMEDICATION PREP.  1) Do not eat or drink anything after 5:30am (4 hours prior to your test) 2) You have been given 2 bottles of oral contrast to drink. The solution may taste               better if refrigerated, but do NOT add ice or any other liquid to this solution. Shake             well before drinking.    Drink 1 bottle of contrast @ 7:30am(2 hours prior to your exam)  Drink 1 bottle of contrast @ 8:30am (1 hour prior to your exam)  You may take any medications as prescribed with a small amount of water except for the following: Metformin, Glucophage, Glucovance, Avandamet, Riomet, Fortamet, Actoplus Met, Janumet, Glumetza or Metaglip. The above medications must be held the day of the exam AND 48 hours after the exam.  The purpose of you drinking the oral contrast is to aid in the visualization of your intestinal tract. The contrast solution may cause some diarrhea. Before your exam is started, you will be given a small amount of fluid to drink. Depending on your individual set of symptoms, you may also receive an intravenous injection of x-ray contrast/dye. Plan on being at [facility] for 30 minutes or longer, depending on the type of exam you are having performed.  You have been scheduled for an endoscopy. Please follow written instructions given to you at your visit today. If you use inhalers (even only as needed), please bring them with you on the day of your procedure. Your physician has requested  that you go to www.startemmi.com and enter the access code given to you at your visit today. This web site gives a general overview about your procedure. However, you should still follow specific instructions given to you by our office regarding your preparation for the procedure.  This test typically takes 30-45 minutes to complete.  If you have any questions regarding your exam or if you need to reschedule, you may call the CT department at [phone] between the hours of 8:00 am and 5:00 pm, Monday-Friday.   Your physician has requested that you go to the basement for the following lab work before leaving today: AFP  ________________________________________________________________________

## 2015-04-22 LAB — AFP TUMOR MARKER: AFP TUMOR MARKER: 6 ng/mL (ref <6.1)

## 2015-04-27 ENCOUNTER — Ambulatory Visit (INDEPENDENT_AMBULATORY_CARE_PROVIDER_SITE_OTHER)
Admission: RE | Admit: 2015-04-27 | Discharge: 2015-04-27 | Disposition: A | Discharge status: Home / Self Care | Payer: No Typology Code available for payment source | Source: Ambulatory Visit | Attending: Gastroenterology | Admitting: Gastroenterology

## 2015-04-27 DIAGNOSIS — R140 Abdominal distension (gaseous): Secondary | ICD-10-CM

## 2015-04-27 DIAGNOSIS — K7031 Alcoholic cirrhosis of liver with ascites: Secondary | ICD-10-CM

## 2015-04-27 DIAGNOSIS — E871 Hypo-osmolality and hyponatremia: Secondary | ICD-10-CM

## 2015-04-27 DIAGNOSIS — I9589 Other hypotension: Secondary | ICD-10-CM

## 2015-04-27 MED ORDER — IOPAMIDOL (ISOVUE-300) INJECTION 61%
100.0000 mL | Freq: Once | INTRAVENOUS | Status: AC | PRN
  Administered 2015-04-27: 100 mL via INTRAVENOUS

## 2015-04-28 ENCOUNTER — Ambulatory Visit: Payer: No Typology Code available for payment source | Attending: Family Medicine | Admitting: Family Medicine

## 2015-04-28 ENCOUNTER — Telehealth: Payer: Self-pay | Admitting: Family Medicine

## 2015-04-28 ENCOUNTER — Encounter: Payer: Self-pay | Admitting: Family Medicine

## 2015-04-28 VITALS — BP 102/66 | HR 61 | Temp 98.1°F | Resp 15 | Ht 65.0 in | Wt 160.2 lb

## 2015-04-28 DIAGNOSIS — K0889 Other specified disorders of teeth and supporting structures: Secondary | ICD-10-CM | POA: Insufficient documentation

## 2015-04-28 DIAGNOSIS — Z79899 Other long term (current) drug therapy: Secondary | ICD-10-CM | POA: Insufficient documentation

## 2015-04-28 DIAGNOSIS — H527 Unspecified disorder of refraction: Secondary | ICD-10-CM

## 2015-04-28 DIAGNOSIS — Z23 Encounter for immunization: Secondary | ICD-10-CM

## 2015-04-28 DIAGNOSIS — K7031 Alcoholic cirrhosis of liver with ascites: Secondary | ICD-10-CM

## 2015-04-28 DIAGNOSIS — M5441 Lumbago with sciatica, right side: Secondary | ICD-10-CM | POA: Insufficient documentation

## 2015-04-28 MED ORDER — PANTOPRAZOLE SODIUM 40 MG PO TBEC: 40.0000 mg | DELAYED_RELEASE_TABLET | Freq: Every day | ORAL | Status: DC

## 2015-04-28 MED ORDER — ALBUTEROL SULFATE HFA 108 (90 BASE) MCG/ACT IN AERS: 2.0000 | INHALATION_SPRAY | Freq: Four times a day (QID) | RESPIRATORY_TRACT | Status: DC | PRN

## 2015-04-28 MED ORDER — AMOXICILLIN 500 MG PO CAPS: 500.0000 mg | ORAL_CAPSULE | Freq: Three times a day (TID) | ORAL | Status: DC

## 2015-04-28 MED ORDER — METHOCARBAMOL 500 MG PO TABS: 500.0000 mg | ORAL_TABLET | Freq: Three times a day (TID) | ORAL | Status: DC | PRN

## 2015-04-28 MED ORDER — FUROSEMIDE 20 MG PO TABS: 20.0000 mg | ORAL_TABLET | Freq: Every day | ORAL | Status: DC

## 2015-04-28 MED ORDER — TRAMADOL HCL 50 MG PO TABS: 50.0000 mg | ORAL_TABLET | Freq: Two times a day (BID) | ORAL | Status: DC | PRN

## 2015-04-28 MED ORDER — SPIRONOLACTONE 25 MG PO TABS: 25.0000 mg | ORAL_TABLET | Freq: Every day | ORAL | Status: DC

## 2015-04-28 NOTE — Telephone Encounter (Signed)
Patient was given shot at her visit.  Called and clarified

## 2015-04-28 NOTE — Patient Instructions (Signed)
Cirrhosis °Cirrhosis is long-term (chronic) liver injury. The liver is your largest internal organ, and it performs many functions. The liver converts food into energy, removes toxic material from your blood, makes important proteins, and absorbs necessary vitamins from your diet. °If you have cirrhosis, it means many of your healthy liver cells have been replaced by scar tissue. This prevents blood from flowing through your liver, which makes it difficult for your liver to function. This scarring is not reversible, but treatment can prevent it from getting worse.  °CAUSES  °Hepatitis C and long-term alcohol abuse are the most common causes of cirrhosis. Other causes include: °· Nonalcoholic fatty liver disease. °· Hepatitis B infection. °· Autoimmune hepatitis. °· Diseases that cause blockage of ducts inside the liver. °· Inherited liver diseases. °· Reactions to certain long-term medicines. °· Parasitic infections. °· Long-term exposure to certain toxins. °RISK FACTORS °You may have a higher risk of cirrhosis if you: °· Have certain hepatitis viruses. °· Abuse alcohol, especially if you are female. °· Are overweight. °· Share needles. °· Have unprotected sex with someone who has hepatitis. °SYMPTOMS  °You may not have any signs and symptoms at first. Symptoms may not develop until the damage to your liver starts to get worse. Signs and symptoms of cirrhosis may include:  °· Tenderness in the right-upper part of your abdomen. °· Weakness and tiredness (fatigue). °· Loss of appetite. °· Nausea. °· Weight loss and muscle loss. °· Itchiness. °· Yellow skin and eyes (jaundice). °· Buildup of fluid in the abdomen (ascites). °· Swelling of the feet and ankles (edema). °· Appearance of tiny blood vessels under the skin. °· Mental confusion. °· Easy bruising and bleeding. °DIAGNOSIS  °Your health care provider may suspect cirrhosis based on your symptoms and medical history, especially if you have other medical conditions  or a history of alcohol abuse. Your health care provider will do a physical exam to feel your liver and check for signs of cirrhosis. Your health care provider may perform other tests, including:  °· Blood tests to check:   °¨ Whether you have hepatitis B or C.   °¨ Kidney function. °¨ Liver function. °· Imaging tests such as: °¨ MRI or CT scan to look for changes seen in advanced cirrhosis. °¨ Ultrasound to see if normal liver tissue is being replaced by scar tissue. °· A procedure using a long needle to take a sample of liver tissue (biopsy) for examination under a microscope. Liver biopsy can confirm the diagnosis of cirrhosis.   °TREATMENT  °Treatment depends on how damaged your liver is and what caused the damage. Treatment may include treating cirrhosis symptoms or treating the underlying causes of the condition to try to slow the progression of the damage. Treatment may include: °· Making lifestyle changes, such as:   °¨ Eating a healthy diet. °¨ Restricting salt intake.  °¨ Maintaining a healthy weight.   °¨ Not abusing drugs or alcohol. °· Taking medicines to: °¨ Treat liver infections or other infections. °¨ Control itching. °¨ Reduce fluid buildup. °¨ Reduce certain blood toxins. °¨ Reduce risk of bleeding from enlarged blood vessels in the stomach or esophagus (varices). °· If varices are causing bleeding problems, you may need treatment with a procedure that ties up the vessels causing them to fall off (band ligation). °· If cirrhosis is causing your liver to fail, your health care provider may recommend a liver transplant. °· Other treatments may be recommended depending on any complications of cirrhosis, such as liver-related kidney failure (hepatorenal   syndrome). °HOME CARE INSTRUCTIONS  °· Take medicines only as directed by your health care provider. Do not use drugs that are toxic to your liver. Ask your health care provider before taking any new medicines, including over-the-counter medicines.    °· Rest as needed. °· Eat a well-balanced diet. Ask your health care provider or dietitian for more information.   °· You may have to follow a low-salt diet or restrict your water intake as directed. °· Do not drink alcohol. This is especially important if you are taking acetaminophen. °· Keep all follow-up visits as directed by your health care provider. This is important. °SEEK MEDICAL CARE IF: °· You have fatigue or weakness that is getting worse. °· You develop swelling of the hands, feet, legs, or face. °· You have a fever. °· You develop loss of appetite. °· You have nausea or vomiting. °· You develop jaundice. °· You develop easy bruising or bleeding. °SEEK IMMEDIATE MEDICAL CARE IF: °· You vomit bright red blood or a material that looks like coffee grounds. °· You have blood in your stools. °· Your stools appear black and tarry. °· You become confused. °· You have chest pain or trouble breathing. °  °This information is not intended to replace advice given to you by your health care provider. Make sure you discuss any questions you have with your health care provider. °  °Document Released: 01/09/2005 Document Revised: 01/30/2014 Document Reviewed: 09/17/2013 °Elsevier Interactive Patient Education ©2016 Elsevier Inc. ° °

## 2015-04-28 NOTE — Progress Notes (Signed)
Subjective:  Patient ID: Janet Tyler, female    DOB: [birth year]  Age: 50 y.o. MRN: [id number]  CC: Follow-up   HPI Janet Tyler is a 50 year old female with a history of alcoholic liver cirrhosis who comes into the clinic for a follow-up visit.  She was recently seen by [facility] and had an alpha-fetoprotein done which came back normal; CT abdomen and pelvis revealed hepatic cirrhosis with evidence of portal venous hypertension, mild splenomegaly, mild esophageal varices, moderate ascites. She is scheduled for an upper endoscopy later this month.  Complains of pain in her back, neck and cannot seem to get comfortable when lying down leading to insomnia. She has mild edema of her lower extremities. Also complains of multiple teeth hurting and would like to be referred to Dentist. She also has some refractive errors and blurry vision.   Outpatient Prescriptions Prior to Visit  Medication Sig Dispense Refill  . guaiFENesin-codeine (ROBITUSSIN AC) 100-10 MG/5ML syrup Take 10 mLs by mouth 4 (four) times daily as needed for cough. 180 mL 0  . Multiple Vitamins-Minerals (MULTIVITAMIN GUMMIES WOMENS PO) Take 2 capsules by mouth once.    Marland Kitchen OVER THE COUNTER MEDICATION Take 1 tablet by mouth daily.    Marland Kitchen OVER THE COUNTER MEDICATION Take 2 tablets by mouth daily.    Marland Kitchen triamcinolone cream (KENALOG) 0.1 % Apply 1 application topically 2 (two) times daily. 30 g 0  . albuterol (PROVENTIL HFA;VENTOLIN HFA) 108 (90 Base) MCG/ACT inhaler Inhale 2 puffs into the lungs every 6 (six) hours as needed for wheezing or shortness of breath. 1 Inhaler 0  . furosemide (LASIX) 20 MG tablet Take 1 tablet (20 mg total) by mouth daily. Take 1 extra pill with severe pedal edema. 40 tablet 2  . methocarbamol (ROBAXIN) 500 MG tablet Take 1 tablet (500 mg total) by mouth every 8 (eight) hours as needed for muscle spasms. 90 tablet 1  . pantoprazole (PROTONIX) 40 MG tablet Take 1 tablet (40 mg total) by mouth daily. 30  tablet 1  . spironolactone (ALDACTONE) 25 MG tablet Take 1 tablet (25 mg total) by mouth daily. 30 tablet 3  . albuterol-ipratropium (COMBIVENT) 18-103 MCG/ACT inhaler Inhale 2 puffs into the lungs every 4 (four) hours as needed for wheezing or shortness of breath. Reported on 04/28/2015    . levofloxacin (LEVAQUIN) 500 MG tablet Take 1 tablet (500 mg total) by mouth daily. (Patient not taking: Reported on 04/28/2015) 7 tablet 0   No facility-administered medications prior to visit.    ROS Review of Systems Constitutional: Negative for activity change, appetite change and fatigue.  HENT: Negative for congestion, sinus pressure and sore throat, dental complaints  Eyes: Positive for visual disturbance.  Respiratory: Negative for cough, chest tightness, shortness of breath and wheezing.   Cardiovascular: Positive for leg swelling. Negative for chest pain and palpitations.  Gastrointestinal: Positive for abdominal distention. Negative for abdominal pain and constipation.  Endocrine: Negative for polydipsia.  Genitourinary: Negative for dysuria and frequency.  Musculoskeletal: Positive for back pain  Skin: Negative Neurological: Negative for tremors, light-headedness and numbness.  Hematological: Does not bruise/bleed easily.  Psychiatric/Behavioral: Negative for behavioral problems and agitation.   Objective:  BP 102/66 mmHg  Pulse 61  Temp(Src) 98.1 F (36.7 C)  Resp 15  Ht  (1.651 m)  Wt 160 lb 3.2 oz (72.666 kg)  BMI 26.66 kg/m2  SpO2 95%  BP/Weight 04/28/2015 04/21/2015 03/17/2015  Systolic BP 102 94 97  Diastolic BP 66  58 57  Wt. (Lbs) 160.2 161.2 -  BMI 26.66 26.83 -      Physical Exam Constitutional: Patient appears well-developed and well-nourished. No distress. CVS: normal rate and rhythm , S1/S2 +, no murmurs, no gallops, no carotid bruit.  Pulmonary: Effort and breath sounds normal, no stridor, rhonchi, wheezes, rales.  Abdominal: Umbilical hernia with ascites    Musculoskeletal: Normal range of motion. Bilateral 1+pedal edema. Lymphadenopathy: No lymphadenopathy noted, cervical, inguinal or axillary Neuro: Alert. Normal reflexes, muscle tone coordination. No cranial nerve deficit. Skin: spider naevi on anterior abdominal wall, varicose veins on feet Psychiatric: Normal mood and affect. Behavior, judgment, thought content normal.  CMP Latest Ref Rng 03/10/2015 02/03/2015 12/21/2014  Glucose 65 - 99 mg/dL 82 161(W) 960(A)  BUN 7 - 25 mg/dL 5(L) 3(L) 4(L)  Creatinine 0.50 - 1.10 mg/dL 5.40(J) 8.11 9.14(N)  Sodium 135 - 146 mmol/L 131(L) 134(L) 132(L)  Potassium 3.5 - 5.3 mmol/L 4.9 4.7 4.9  Chloride 98 - 110 mmol/L 101 106 100  CO2 20 - 31 mmol/L Calcium 8.6 - 10.2 mg/dL 9.0 8.2(N) 8.7  Total Protein 6.1 - 8.1 g/dL 7.0 - 6.6  Total Bilirubin 0.2 - 1.2 mg/dL 2.3(H) - 1.6(H)  Alkaline Phos 33 - 115 U/L 141(H) - 130(H)  AST 10 - 35 U/L 54(H) - 68(H)  ALT 6 - 29 U/L 22 - 23     CLINICAL DATA: Alcoholic cirrhosis with ascites. Abdominal tenderness and distention. Elevated tumor markers.  EXAM: CT ABDOMEN AND PELVIS WITH CONTRAST  TECHNIQUE: Multidetector CT imaging of the abdomen and pelvis was performed using the standard protocol following bolus administration of intravenous contrast.  CONTRAST: 100 mL Isovue 300  COMPARISON: None.  FINDINGS: Lower chest: No acute findings.  Hepatobiliary: Hepatic cirrhosis is demonstrated, however no liver masses are identified. Portal veins are patent. Recanalization of periumbilical veins and mild esophageal varices noted, consistent with portal venous hypertension. Moderate ascites is seen.  Tiny calcified gallstones seen without evidence cholecystitis or biliary ductal dilatation.  Pancreas: No mass, inflammatory changes, or other significant abnormality.  Spleen: Mild splenomegaly. No splenic masses identified. Prominent portosystemic collaterals are seen in the  left upper quadrant. These findings are also consistent with portal venous hypertension.  Adrenals/Urinary Tract: No masses identified. No evidence of hydronephrosis.  Stomach/Bowel: No evidence bowel obstruction. Mild diffuse small bowel wall thickening is likely secondary to hypoalbuminemia.  Vascular/Lymphatic: No pathologically enlarged lymph nodes. No evidence of abdominal aortic aneurysm.  Reproductive: No mass or other significant abnormality.  Other: None.  Musculoskeletal: No suspicious bone lesions identified.  IMPRESSION: Hepatic cirrhosis. No evidence of hepatic neoplasm.  Signs of portal venous hypertension including mild splenomegaly, prominent portosystemic collaterals, recanalization periumbilical veins and mild esophageal varices.  Moderate ascites. Mild small bowel wall thickening likely due to hypoalbuminemia.  Cholelithiasis. No radiographic evidence of cholecystitis.   Electronically Signed  By: Myles Rosenthal M.D.  On: 04/27/2015 10:16       Assessment & Plan:   1. Alcoholic cirrhosis of liver with ascites (HCC) No longer drinking alcohol MELD score of 6 - Pneumococcal polysaccharide vaccine 23-valent greater than or equal to 2yo subcutaneous/IM - pantoprazole (PROTONIX) 40 MG tablet; Take 1 tablet (40 mg total) by mouth daily.  Dispense: 30 tablet; Refill: 1 - spironolactone (ALDACTONE) 25 MG tablet; Take 1 tablet (25 mg total) by mouth daily.  Dispense: 30 tablet; Refill: 3 - furosemide (LASIX) 20 MG tablet; Take 1 tablet (20 mg total) by mouth  daily. Take 1 extra pill with severe pedal edema.  Dispense: 40 tablet; Refill: 2  2. Bilateral low back pain with right-sided sciatica Placed on tramadol - methocarbamol (ROBAXIN) 500 MG tablet; Take 1 tablet (500 mg total) by mouth every 8 (eight) hours as needed for muscle spasms.  Dispense: 90 tablet; Refill: 1 - Drug Screen, Urine, No Confirmation  3. Tooth ache Referred to  dentist - amoxicillin (AMOXIL) 500 MG capsule; Take 1 capsule (500 mg total) by mouth 3 (three) times daily.  Dispense: 30 capsule; Refill: 0  4. Refractive errors Discussed available community resources to the patient and she knows to seek out services of an optometrist as we do not have any options for referral to optometrist at this time.   Meds ordered this encounter  Medications  . traMADol (ULTRAM) 50 MG tablet    Sig: Take 1 tablet (50 mg total) by mouth every 12 (twelve) hours as needed.    Dispense:  40 tablet    Refill:  1  . amoxicillin (AMOXIL) 500 MG capsule    Sig: Take 1 capsule (500 mg total) by mouth 3 (three) times daily.    Dispense:  30 capsule    Refill:  0  . albuterol (PROVENTIL HFA;VENTOLIN HFA) 108 (90 Base) MCG/ACT inhaler    Sig: Inhale 2 puffs into the lungs every 6 (six) hours as needed for wheezing or shortness of breath.    Dispense:  1 Inhaler    Refill:  0  . pantoprazole (PROTONIX) 40 MG tablet    Sig: Take 1 tablet (40 mg total) by mouth daily.    Dispense:  30 tablet    Refill:  1  . spironolactone (ALDACTONE) 25 MG tablet    Sig: Take 1 tablet (25 mg total) by mouth daily.    Dispense:  30 tablet    Refill:  3  . furosemide (LASIX) 20 MG tablet    Sig: Take 1 tablet (20 mg total) by mouth daily. Take 1 extra pill with severe pedal edema.    Dispense:  40 tablet    Refill:  2    Discontinue previous dose  . methocarbamol (ROBAXIN) 500 MG tablet    Sig: Take 1 tablet (500 mg total) by mouth every 8 (eight) hours as needed for muscle spasms.    Dispense:  90 tablet    Refill:  1    Follow-up: Return in about 3 months (around 07/28/2015) for Follow-up liver cirrhosis.   Jaclyn ShaggyEnobong Amao MD

## 2015-04-28 NOTE — Progress Notes (Signed)
Follow up Is asking for results of test she had done yesterday (CT abdomen) Would like eye MD appointment as well as dentist appointment Needs refills

## 2015-04-28 NOTE — Telephone Encounter (Signed)
Pt. Called stating she had an appointment today and forgot to get her pneumonia shot. Pt. Stated she was not certain if that is the shot she needed. Please f/u with pt.

## 2015-04-29 LAB — DRUG SCREEN, URINE, NO CONFIRMATION
Amphetamine Screen, Ur: NEGATIVE
BARBITURATE QUANT UR: NEGATIVE
Benzodiazepines.: NEGATIVE
Cocaine Metabolites: NEGATIVE
Creatinine,U: 162 mg/dL
MARIJUANA METABOLITE: NEGATIVE
METHADONE: NEGATIVE
OPIATE SCREEN, URINE: NEGATIVE
PHENCYCLIDINE (PCP): NEGATIVE
PROPOXYPHENE: NEGATIVE

## 2015-04-30 ENCOUNTER — Telehealth: Payer: Self-pay | Admitting: Clinical

## 2015-04-30 NOTE — Telephone Encounter (Signed)
F/u with pt, [facility] not [facility] to [facility] yet, but says she will go on Monday 05-03-15 as a walk-in at 8:30am, or "at the crack of dawn". Pt also aware [facility], as needed, within the next three weeks, before [facility] another [facility].

## 2015-05-05 ENCOUNTER — Encounter (HOSPITAL_COMMUNITY): Payer: Self-pay | Admitting: *Deleted

## 2015-05-18 ENCOUNTER — Ambulatory Visit (HOSPITAL_COMMUNITY)
Admission: RE | Admit: 2015-05-18 | Discharge: 2015-05-18 | Disposition: A | Discharge status: Home / Self Care | Payer: Self-pay | Source: Ambulatory Visit | Attending: Gastroenterology | Admitting: Gastroenterology

## 2015-05-18 ENCOUNTER — Ambulatory Visit (HOSPITAL_COMMUNITY): Payer: Self-pay | Admitting: Certified Registered"

## 2015-05-18 ENCOUNTER — Encounter (HOSPITAL_COMMUNITY): Payer: Self-pay | Admitting: Anesthesiology

## 2015-05-18 ENCOUNTER — Encounter (HOSPITAL_COMMUNITY)
Admission: RE | Disposition: A | Discharge status: Home / Self Care | Payer: Self-pay | Source: Ambulatory Visit | Attending: Gastroenterology

## 2015-05-18 DIAGNOSIS — R05 Cough: Secondary | ICD-10-CM | POA: Insufficient documentation

## 2015-05-18 DIAGNOSIS — F17210 Nicotine dependence, cigarettes, uncomplicated: Secondary | ICD-10-CM | POA: Insufficient documentation

## 2015-05-18 DIAGNOSIS — I8510 Secondary esophageal varices without bleeding: Secondary | ICD-10-CM

## 2015-05-18 DIAGNOSIS — I8500 Esophageal varices without bleeding: Secondary | ICD-10-CM | POA: Insufficient documentation

## 2015-05-18 DIAGNOSIS — J449 Chronic obstructive pulmonary disease, unspecified: Secondary | ICD-10-CM | POA: Insufficient documentation

## 2015-05-18 DIAGNOSIS — K7031 Alcoholic cirrhosis of liver with ascites: Secondary | ICD-10-CM

## 2015-05-18 DIAGNOSIS — M419 Scoliosis, unspecified: Secondary | ICD-10-CM | POA: Insufficient documentation

## 2015-05-18 DIAGNOSIS — K7460 Unspecified cirrhosis of liver: Secondary | ICD-10-CM | POA: Insufficient documentation

## 2015-05-18 DIAGNOSIS — I739 Peripheral vascular disease, unspecified: Secondary | ICD-10-CM | POA: Insufficient documentation

## 2015-05-18 DIAGNOSIS — Z79899 Other long term (current) drug therapy: Secondary | ICD-10-CM | POA: Insufficient documentation

## 2015-05-18 DIAGNOSIS — M1990 Unspecified osteoarthritis, unspecified site: Secondary | ICD-10-CM | POA: Insufficient documentation

## 2015-05-18 DIAGNOSIS — E871 Hypo-osmolality and hyponatremia: Secondary | ICD-10-CM

## 2015-05-18 DIAGNOSIS — I9589 Other hypotension: Secondary | ICD-10-CM

## 2015-05-18 DIAGNOSIS — R140 Abdominal distension (gaseous): Secondary | ICD-10-CM

## 2015-05-18 HISTORY — DX: Nonspecific low blood-pressure reading: R03.1

## 2015-05-18 HISTORY — DX: Gastro-esophageal reflux disease without esophagitis: K21.9

## 2015-05-18 HISTORY — DX: Unspecified asthma, uncomplicated: J45.909

## 2015-05-18 HISTORY — PX: ESOPHAGOGASTRODUODENOSCOPY (EGD) WITH PROPOFOL: SHX5813

## 2015-05-18 HISTORY — DX: Unspecified osteoarthritis, unspecified site: M19.90

## 2015-05-18 HISTORY — DX: Personal history of other specified conditions: Z87.898

## 2015-05-18 HISTORY — DX: Chronic obstructive pulmonary disease, unspecified: J44.9

## 2015-05-18 HISTORY — DX: Varicose veins of bilateral lower extremities with other complications: I83.893

## 2015-05-18 SURGERY — ESOPHAGOGASTRODUODENOSCOPY (EGD) WITH PROPOFOL
Anesthesia: Monitor Anesthesia Care

## 2015-05-18 MED ORDER — LIDOCAINE HCL (CARDIAC) 20 MG/ML IV SOLN
INTRAVENOUS | Status: AC
Start: 2015-05-18 — End: 2015-05-18
  Filled 2015-05-18: qty 5

## 2015-05-18 MED ORDER — SODIUM CHLORIDE 0.9 % IV SOLN: INTRAVENOUS | Status: DC

## 2015-05-18 MED ORDER — PROPOFOL 10 MG/ML IV BOLUS
INTRAVENOUS | Status: DC | PRN
  Administered 2015-05-18: 20 mg via INTRAVENOUS
  Administered 2015-05-18: 140 mg via INTRAVENOUS

## 2015-05-18 MED ORDER — LACTATED RINGERS IV SOLN
INTRAVENOUS | Status: DC
  Administered 2015-05-18: 1000 mL via INTRAVENOUS

## 2015-05-18 MED ORDER — PROPOFOL 10 MG/ML IV BOLUS
INTRAVENOUS | Status: AC
  Filled 2015-05-18: qty 20

## 2015-05-18 MED ORDER — LIDOCAINE HCL (CARDIAC) 20 MG/ML IV SOLN
INTRAVENOUS | Status: DC | PRN
  Administered 2015-05-18: 60 mg via INTRAVENOUS
  Administered 2015-05-18: 40 mg via INTRAVENOUS

## 2015-05-18 NOTE — H&P (View-Only) (Signed)
[facility] Consult Note:  History: Janet IvoryDeana Schwenke 04/21/2015  Referring physician: Jaclyn ShaggyEnobong, Amao, MD  Reason for consult/chief complaint: Cirrhosis   Subjective HPI:  Jasira was referred to the local health clinic for evaluation of cirrhosis with ascites. She was a heavy alcohol user until November 2016, when she quit on her birthday. She reports being hospitalized for fluid overload. Her recent PCP visit indicates she has only tolerated low-dose diuretics because of hypotension. At that visit her systolic blood pressure was 60. Therefore, her Lasix dose was decreased from 40-20 mg, spironolactone dose was Stable at 25 mg. Kimmi complains of market abdominal distention as well as an umbilical hernia, peripheral edema that becomes severe by the end of the day as well as bruising on her legs and varicose veins. She denies being lightheaded or having episodes of syncope. She has a chronic dry cough and unfortunately continues to smoke cigarettes. She has never had an upper GI bleed ROS:  Review of Systems  Constitutional: Negative for appetite change and unexpected weight change.  HENT: Positive for nosebleeds. Negative for mouth sores and voice change.   Eyes: Negative for pain and redness.  Respiratory: Positive for cough. Negative for shortness of breath.   Cardiovascular: Negative for chest pain and palpitations.  Genitourinary: Negative for dysuria and hematuria.  Musculoskeletal: Positive for myalgias. Negative for arthralgias.  Skin: Negative for pallor and rash.  Neurological: Negative for weakness and headaches.  Hematological: Negative for adenopathy.  Psychiatric/Behavioral: Positive for dysphoric mood. The patient is nervous/anxious.    also urinary leakage Past Medical History: Past Medical History  Diagnosis Date  . Anxiety   . Depression   . Scoliosis   . Cirrhosis (HCC)   . Substance abuse      Past Surgical History: History reviewed. No pertinent  past surgical history. No prior surgery  Family History: Family History  Problem Relation Age of Onset  . Arthritis Mother   . Heart disease Mother   . Hypertension Mother   . Stroke Mother     Social History: Social History   Social History  . Marital Status: Single    Spouse Name: N/A  . Number of Children: N/A  . Years of Education: N/A   Social History Main Topics  . Smoking status: Current Every Day Smoker -- 0.50 packs/day    Types: Cigarettes  . Smokeless tobacco: Never Used  . Alcohol Use: No     Comment: last drink 12/05/14  . Drug Use: No  . Sexual Activity: Not Asked   Other Topics Concern  . None   Social History Narrative    Allergies: No Known Allergies  Outpatient Meds: Current Outpatient Prescriptions  Medication Sig Dispense Refill  . albuterol (PROVENTIL HFA;VENTOLIN HFA) 108 (90 Base) MCG/ACT inhaler Inhale 2 puffs into the lungs every 6 (six) hours as needed for wheezing or shortness of breath. 1 Inhaler 0  . albuterol-ipratropium (COMBIVENT) 18-103 MCG/ACT inhaler Inhale 2 puffs into the lungs every 4 (four) hours as needed for wheezing or shortness of breath. Reported on 03/10/2015    . furosemide (LASIX) 20 MG tablet Take 1 tablet (20 mg total) by mouth daily. Take 1 extra pill with severe pedal edema. 40 tablet 2  . guaiFENesin-codeine (ROBITUSSIN AC) 100-10 MG/5ML syrup Take 10 mLs by mouth 4 (four) times daily as needed for cough. 180 mL 0  . levofloxacin (LEVAQUIN) 500 MG tablet Take 1 tablet (500 mg total) by mouth daily. 7 tablet 0  . methocarbamol (  ROBAXIN) 500 MG tablet Take 1 tablet (500 mg total) by mouth every 8 (eight) hours as needed for muscle spasms. 90 tablet 1  . Multiple Vitamins-Minerals (MULTIVITAMIN GUMMIES WOMENS PO) Take 2 capsules by mouth once.    Marland Kitchen. OVER THE COUNTER MEDICATION Take 1 tablet by mouth daily.    Marland Kitchen. OVER THE COUNTER MEDICATION Take 2 tablets by mouth daily.    . pantoprazole (PROTONIX) 40 MG tablet Take 1  tablet (40 mg total) by mouth daily. 30 tablet 1  . spironolactone (ALDACTONE) 25 MG tablet Take 1 tablet (25 mg total) by mouth daily. 30 tablet 3  . triamcinolone cream (KENALOG) 0.1 % Apply 1 application topically 2 (two) times daily. 30 g 0   No current facility-administered medications for this visit.      ___________________________________________________________________ Objective  Exam:  BP 94/58 mmHg  Pulse 101  Ht 5\' 5"  (1.651 m)  Wt 161 lb 3.2 oz (73.12 kg)  BMI 26.83 kg/m2  SpO2 98%   General: this is a(n) Chronically ill-appearing white woman who looks older than stated age, decreased generalized muscle mass   Eyes: sclera anicteric, no redness  ENT: oral mucosa moist without lesions, no cervical or supraclavicular lymphadenopathy, poor dentition  CV: RRR without murmur, S1/S2, no JVD, 1+ pretibial edema bilaterally, varicose veins below the knee bilaterally as well as venous stasis changes  Resp: clear to auscultation bilaterally, normal RR and effort noted  GI: soft, mild epigastric tenderness, with active bowel sounds. Enlarged left hepatic lobe noted. Bulging flanks, no shifting dullness, small reducible umbilical hernia  Skin; warm and dry, no rash or jaundice noted  Neuro: awake, alert and oriented x 3. Normal gross motor function and fluent speech, no asterixis  Gait is steady, speech fluent  Labs:  CMP Latest Ref Rng 03/10/2015 02/03/2015 12/21/2014  Glucose 65 - 99 mg/dL 82 161(W106(H) 960(A109(H)  BUN 7 - 25 mg/dL 5(L) 3(L) 4(L)  Creatinine 0.50 - 1.10 mg/dL 5.40(J0.48(L) 8.110.51 9.14(N0.45(L)  Sodium 135 - 146 mmol/L 131(L) 134(L) 132(L)  Potassium 3.5 - 5.3 mmol/L 4.9 4.7 4.9  Chloride 98 - 110 mmol/L 101 106 100  CO2 20 - 31 mmol/L 24 23 26   Calcium 8.6 - 10.2 mg/dL 9.0 8.2(N8.4(L) 8.7  Total Protein 6.1 - 8.1 g/dL 7.0 - 6.6  Total Bilirubin 0.2 - 1.2 mg/dL 2.3(H) - 1.6(H)  Alkaline Phos 33 - 115 U/L 141(H) - 130(H)  AST 10 - 35 U/L 54(H) - 68(H)  ALT 6 - 29 U/L 22 -  23    Albumin 2.7  Lab Results  Component Value Date   WBC 5.9 03/10/2015   HGB 11.4* 03/10/2015   HCT 32.4* 03/10/2015   MCV 93.9 03/10/2015   PLT 153 03/10/2015   inr 1.4  MELD = 18, including sodium factor  Hepatitis ABC panel in June 2015 was negative  Radiologic Studies:  Recent Abd U/S - no pocket of fluid for tap  Assessment: Encounter Diagnoses  Name Primary?  . Alcoholic cirrhosis of liver with ascites (HCC) Yes  . Hyponatremia   . Abdominal distension   . Other specified hypotension     This patient has advanced liver disease, with a combination of sodium/fluid overload that is managed on low-dose diuretics, which is fortunate since her blood pressure is low. No current evidence of hepatic encephalopathy It is unknown if she has esophageal or gastric varices  Despite her ME LD score, she is not currently a candidate for liver transplant evaluation  since she has not yet 6 months abstinent, and also needs to undergo formal alcohol rehabilitation. It is not clear to me that she has a robust social support network.  Plan: Alpha-fetoprotein Written materials given for low sodium diet. I stressed the importance of this per CT scan abdomen and pelvis with contrast given her abdominal distention and complaints of fluid overload despite lack of fluid collection seen on ultrasound. I suspect she has a lot of fluid in the mesentery that cannot be tapped EGD to screen for esophageal varices  The benefits and risks of the planned procedure were described in detail with the patient or (when appropriate) their health care proxy.  Risks were outlined as including, but not limited to, bleeding, infection, perforation, adverse medication reaction leading to cardiac or pulmonary decompensation, or pancreatitis (if ERCP).  The limitation of incomplete mucosal visualization was also discussed.  No guarantees or warranties were given.  A follow-up visit she will need Twinrix  She  is also encouraged in annual flu vaccine, and primary care should consider giving her a Pneumovax, which is recommended for all patients with advanced cirrhosis.  Thank you for the courtesy of this consult.  Please call me with any questions or concerns.  Charlie Pitter III

## 2015-05-18 NOTE — Interval H&P Note (Signed)
History and Physical Interval Note:  05/18/2015 10:17 AM  Janet Tyler  has presented today for surgery, with the diagnosis of Alcoholic cirrhosis, Abdominal distension tt  The various methods of treatment have been discussed with the patient and family. After consideration of risks, benefits and other options for treatment, the patient has consented to  Procedure(s): ESOPHAGOGASTRODUODENOSCOPY (EGD) WITH PROPOFOL (N/A) as a surgical intervention .  The patient's history has been reviewed, patient examined, no change in status, stable for surgery.  I have reviewed the patient's chart and labs.  Questions were answered to the patient's satisfaction.     Charlie PitterHenry L Danis III

## 2015-05-18 NOTE — Anesthesia Postprocedure Evaluation (Signed)
Anesthesia Post Note  Patient: Janet Tyler  Procedure(s) Performed: Procedure(s) (LRB): ESOPHAGOGASTRODUODENOSCOPY (EGD) WITH PROPOFOL (N/A)  Patient location during evaluation: PACU Anesthesia Type: MAC Level of consciousness: awake and alert Pain management: pain level controlled Vital Signs Assessment: post-procedure vital signs reviewed and stable Respiratory status: spontaneous breathing, nonlabored ventilation, respiratory function stable and patient connected to nasal cannula oxygen Cardiovascular status: stable and blood pressure returned to baseline Anesthetic complications: no    Last Vitals:  Filed Vitals:   05/18/15 1050 05/18/15 1100  BP: 103/64 114/67  Pulse: 84 83  Temp:    Resp: 15 15    Last Pain:  Filed Vitals:   05/18/15 1100  PainSc: 2                  Tonda Wiederhold J

## 2015-05-18 NOTE — Anesthesia Preprocedure Evaluation (Addendum)
Anesthesia Evaluation  Patient identified by MRN, date of birth, ID band Patient awake    Reviewed: Allergy & Precautions, NPO status , Patient's Chart, lab work & pertinent test results  Airway Mallampati: II  TM Distance: >3 FB Neck ROM: Full    Dental no notable dental hx.    Pulmonary asthma , COPD,  COPD inhaler, Current Smoker,    Pulmonary exam normal breath sounds clear to auscultation       Cardiovascular + Peripheral Vascular Disease  Normal cardiovascular exam Rhythm:Regular Rate:Normal     Neuro/Psych PSYCHIATRIC DISORDERS Anxiety Depression Schizophrenia negative neurological ROS     GI/Hepatic GERD  ,(+) Cirrhosis     substance abuse  alcohol use,   Endo/Other  negative endocrine ROS  Renal/GU negative Renal ROS  negative genitourinary   Musculoskeletal  (+) Arthritis ,   Abdominal   Peds negative pediatric ROS (+)  Hematology negative hematology ROS (+)   Anesthesia Other Findings   Reproductive/Obstetrics negative OB ROS                             Anesthesia Physical Anesthesia Plan  ASA: III  Anesthesia Plan: MAC   Post-op Pain Management:    Induction: Intravenous  Airway Management Planned: Natural Airway  Additional Equipment:   Intra-op Plan:   Post-operative Plan:   Informed Consent: I have reviewed the patients History and Physical, chart, labs and discussed the procedure including the risks, benefits and alternatives for the proposed anesthesia with the patient or authorized representative who has indicated his/her understanding and acceptance.   Dental advisory given  Plan Discussed with: CRNA  Anesthesia Plan Comments:         Anesthesia Quick Evaluation

## 2015-05-18 NOTE — Discharge Instructions (Signed)

## 2015-05-18 NOTE — Transfer of Care (Signed)
Immediate Anesthesia Transfer of Care Note  Patient: Janet Tyler  Procedure(s) Performed: Procedure(s): ESOPHAGOGASTRODUODENOSCOPY (EGD) WITH PROPOFOL (N/A)  Patient Location: PACU  Anesthesia Type:MAC  Level of Consciousness:  sedated, patient cooperative and responds to stimulation  Airway & Oxygen Therapy:Patient Spontanous Breathing and Patient connected to nasal oxgen  Post-op Assessment:  Report given to PACU RN and Post -op Vital signs reviewed and stable  Post vital signs:  Reviewed and stable  Last Vitals:  Filed Vitals:   05/18/15 1004 05/18/15 1042  BP: 95/53 103/64  Pulse: 85 95  Temp: 36.7 C   Resp: 16 17    Complications: No apparent anesthesia complications

## 2015-05-18 NOTE — Op Note (Signed)
[facility] Patient Name: Janet IvoryDeana Citron Procedure Date: 05/18/2015 MRN: [id number] Attending MD: Starr LakeHenry L. Myrtie Neitheranis , MD Date of Birth: Jul 06, 1965 CSN:  Age: [id number] Admit Type: Outpatient Procedure:                Upper GI endoscopy Indications:              Cirrhosis rule out esophageal varices Providers:                Sherilyn CooterHenry L. Myrtie Neitheranis, MD, Dow AdolphKaren Hinson, RN, Clearnce SorrelKatie Smith,                            Technician, Wandra ScotNicholas Puliham Referring MD:             Jaclyn ShaggyAmao Enobong, MD Medicines:                Monitored Anesthesia Care Complications:            No immediate complications. Estimated Blood Loss:     Estimated blood loss: none. Procedure:                Pre-Anesthesia Assessment:                           - Prior to the procedure, a History and Physical                            was performed, and patient medications and                            allergies were reviewed. The patient's tolerance of                            previous anesthesia was also reviewed. The risks                            and benefits of the procedure and the sedation                            options and risks were discussed with the patient.                            All questions were answered, and informed consent                            was obtained. Prior Anticoagulants: The patient has                            taken no previous anticoagulant or antiplatelet                            agents. ASA Grade Assessment: III - A patient with                            severe systemic disease. After reviewing the risks  and benefits, the patient was deemed in                            satisfactory condition to undergo the procedure.                           After obtaining informed consent, the endoscope was                            passed under direct vision. Throughout the                            procedure, the patient's blood pressure, pulse, and                             oxygen saturations were monitored continuously. The                            [id number]) scope was introduced through the                            mouth, and advanced to the second part of duodenum.                            The upper GI endoscopy was accomplished without                            difficulty. The patient tolerated the procedure                            well. Scope In: Scope Out: Findings:      Grade I varices were found in the lower third of the esophagus.      The stomach was normal.      The examined duodenum was normal.      The cardia and gastric fundus were normal on retroflexion. Impression:               - Grade I esophageal varices.                           - Normal stomach.                           - Normal examined duodenum.                           - No specimens collected. Moderate Sedation:      N/A- Per Anesthesia Care Recommendation:           - Patient has a contact number available for                            emergencies. The signs and symptoms of potential                            delayed complications were discussed with the  patient. Return to normal activities tomorrow.                            Written discharge instructions were provided to the                            patient.                           - Resume previous diet.                           - Continue present medications.                           - Repeat upper endoscopy in 2 years for                            surveillance.                           - Follow up at clinic in 6 weeks to reassess                            ascites and blood pressure. Procedure Code(s):        --- Professional ---                           [phone], Esophagogastroduodenoscopy, flexible,                            transoral; diagnostic, including collection of                            specimen(s) by brushing or washing, when performed                             (separate procedure) Diagnosis Code(s):        --- Professional ---                           K74.60, Unspecified cirrhosis of liver                           I85.10, Secondary esophageal varices without                            bleeding CPT copyright 2016 American Medical Association. All rights reserved. The codes documented in this report are preliminary and upon coder review may  be revised to meet current compliance requirements. Shadia Larose L. Myrtie Neither, MD 05/18/2015 10:42:01 AM This report has been signed electronically. Number of Addenda: 0

## 2015-05-19 ENCOUNTER — Encounter (HOSPITAL_COMMUNITY): Payer: Self-pay | Admitting: Gastroenterology

## 2015-06-23 ENCOUNTER — Other Ambulatory Visit: Payer: Self-pay | Admitting: Family Medicine

## 2015-07-07 MED FILL — AMOXICILLIN 500 MG CAPSULE: 500 | 10 days supply | Qty: 30 | Fill #0

## 2015-07-07 MED FILL — SPIRONOLACTONE 25 MG TABLET: 25 | 30 days supply | Qty: 30 | Fill #0

## 2015-07-07 MED FILL — ?FUROSEMIDE 20 MG TABLET: 20 | 20 days supply | Qty: 40 | Fill #0

## 2015-07-07 MED FILL — ?PANTOPRAZOLE SOD DR 40MG: 40 MG | 30 days supply | Qty: 30 | Fill #0

## 2015-07-07 MED FILL — TRIAMCINOLONE 0.1% CREAM: 0.1 | 15 days supply | Qty: 30 | Fill #0

## 2015-07-15 ENCOUNTER — Encounter: Payer: Self-pay | Admitting: Gastroenterology

## 2015-07-15 ENCOUNTER — Ambulatory Visit (INDEPENDENT_AMBULATORY_CARE_PROVIDER_SITE_OTHER): Payer: No Typology Code available for payment source | Admitting: Gastroenterology

## 2015-07-15 ENCOUNTER — Other Ambulatory Visit (INDEPENDENT_AMBULATORY_CARE_PROVIDER_SITE_OTHER): Payer: No Typology Code available for payment source

## 2015-07-15 VITALS — BP 100/64 | HR 84 | Ht 65.0 in | Wt 161.0 lb

## 2015-07-15 DIAGNOSIS — R748 Abnormal levels of other serum enzymes: Secondary | ICD-10-CM

## 2015-07-15 DIAGNOSIS — Z720 Tobacco use: Secondary | ICD-10-CM

## 2015-07-15 DIAGNOSIS — I8510 Secondary esophageal varices without bleeding: Secondary | ICD-10-CM

## 2015-07-15 DIAGNOSIS — R740 Nonspecific elevation of levels of transaminase and lactic acid dehydrogenase [LDH]: Secondary | ICD-10-CM

## 2015-07-15 DIAGNOSIS — K7031 Alcoholic cirrhosis of liver with ascites: Secondary | ICD-10-CM

## 2015-07-15 LAB — CBC WITH DIFFERENTIAL/PLATELET
BASOS ABS: 0 10*3/uL (ref 0.0–0.1)
BASOS PCT: 0.8 % (ref 0.0–3.0)
EOS ABS: 0.2 10*3/uL (ref 0.0–0.7)
EOS PCT: 3.8 % (ref 0.0–5.0)
HCT: 32.2 % — ABNORMAL LOW (ref 36.0–46.0)
Hemoglobin: 11.2 g/dL — ABNORMAL LOW (ref 12.0–15.0)
Lymphocytes Relative: 33.4 % (ref 12.0–46.0)
Lymphs Abs: 1.6 10*3/uL (ref 0.7–4.0)
MCHC: 34.7 g/dL (ref 30.0–36.0)
MCV: 93.6 fl (ref 78.0–100.0)
MONO ABS: 0.8 10*3/uL (ref 0.1–1.0)
Monocytes Relative: 16.5 % — ABNORMAL HIGH (ref 3.0–12.0)
NEUTROS ABS: 2.2 10*3/uL (ref 1.4–7.7)
NEUTROS PCT: 45.5 % (ref 43.0–77.0)
PLATELETS: 103 10*3/uL — AB (ref 150.0–400.0)
RBC: 3.44 Mil/uL — ABNORMAL LOW (ref 3.87–5.11)
RDW: 15.8 % — ABNORMAL HIGH (ref 11.5–15.5)
WBC: 4.8 10*3/uL (ref 4.0–10.5)

## 2015-07-15 LAB — PROTIME-INR
INR: 1.3 ratio — ABNORMAL HIGH (ref 0.8–1.0)
Prothrombin Time: 14.1 s — ABNORMAL HIGH (ref 9.6–13.1)

## 2015-07-15 LAB — COMPREHENSIVE METABOLIC PANEL
ALK PHOS: 135 U/L — AB (ref 39–117)
ALT: 25 U/L (ref 0–35)
AST: 52 U/L — ABNORMAL HIGH (ref 0–37)
Albumin: 3.3 g/dL — ABNORMAL LOW (ref 3.5–5.2)
BILIRUBIN TOTAL: 1.6 mg/dL — AB (ref 0.2–1.2)
BUN: 8 mg/dL (ref 6–23)
CO2: 28 meq/L (ref 19–32)
Calcium: 9.4 mg/dL (ref 8.4–10.5)
Chloride: 104 mEq/L (ref 96–112)
Creatinine, Ser: 0.6 mg/dL (ref 0.40–1.20)
GFR: 112.65 mL/min (ref >60.00)
GLUCOSE: 84 mg/dL (ref 70–99)
Potassium: 3.9 mEq/L (ref 3.5–5.1)
SODIUM: 135 meq/L (ref 135–145)
TOTAL PROTEIN: 6.9 g/dL (ref 6.0–8.3)

## 2015-07-15 MED ORDER — FUROSEMIDE 20 MG PO TABS: 40.0000 mg | ORAL_TABLET | Freq: Every day | ORAL | Status: DC

## 2015-07-15 NOTE — Progress Notes (Signed)
Janet Tyler Progress Note  Chief Complaint: Alcoholic cirrhosis  Subjective History:  Janet Tyler follows up for her cirrhosis and complications. As before, she has multiple somatic complaints including itching back pain and leg pain anxiety with panic attacks as well as left upper quadrant pain and some urgency for bowel movements. She continues low-dose diuretics, which has been limited by her hypotension. She reports running out of her 20 no gram Lasix, so she took some 40 mg tablet she had from a previous prescription. Her screening upper endoscopy rate 1 esophageal varices that do not need beta blocker therapy ROS: Cardiovascular:  no chest pain, but she has chronic palpitations, especially when anxious. Respiratory: no dyspnea  The patient's Past Medical, Family and Social History were reviewed and are on file in the EMR. She still smokes, but she has been abstinent from alcohol since November 2016. Objective:  Med list reviewed  Vital signs in last 24 hrs: Filed Vitals:   07/15/15 1141  BP: 100/64  Pulse: 84    Physical Exam Chronically ill-appearing woman who looks older than stated age  50: sclera anicteric, oral mucosa moist without lesions  Neck: supple, no thyromegaly, JVD or lymphadenopathy  Cardiac: RRR without murmurs, S1S2 heard, 2+ edema to the knee bilaterally with tenderness  Pulm: clear to auscultation bilaterally, normal RR and effort noted  Abdomen: soft, mild LLQ tenderness, with active bowel sounds. Left lobe of liver enlarged on exam as before. No spleen tip palpable, mild bulging flanks, no distention from the ascites.  Skin; warm and dry, no jaundice or rash. She has an antalgic but steady gait, speech is fluent and no asterixis.  Recent Labs: CBC    Component Value Date/Time   WBC 5.9 03/10/2015 1100   RBC 3.45* 03/10/2015 1100   HGB 11.4* 03/10/2015 1100   HCT 32.4* 03/10/2015 1100   PLT 153 03/10/2015 1100   MCV 93.9 03/10/2015 1100   MCH  33.0 03/10/2015 1100   MCHC 35.2 03/10/2015 1100   RDW 14.1 03/10/2015 1100   LYMPHSABS 1.6 03/10/2015 1100   MONOABS 1.1* 03/10/2015 1100   EOSABS 0.2 03/10/2015 1100   BASOSABS 0.1 03/10/2015 1100    CMP Latest Ref Rng 03/10/2015 02/03/2015 12/21/2014  Glucose 65 - 99 mg/dL 82 106(H) 109(H)  BUN 7 - 25 mg/dL 5(L) 3(L) 4(L)  Creatinine 0.50 - 1.10 mg/dL 0.48(L) 0.51 0.45(L)  Sodium 135 - 146 mmol/L 131(L) 134(L) 132(L)  Potassium 3.5 - 5.3 mmol/L 4.9 4.7 4.9  Chloride 98 - 110 mmol/L 101 106 100  CO2 20 - 31 mmol/L 24 23 26   Calcium 8.6 - 10.2 mg/dL 9.0 8.4(L) 8.7  Total Protein 6.1 - 8.1 g/dL 7.0 - 6.6  Total Bilirubin 0.2 - 1.2 mg/dL 2.3(H) - 1.6(H)  Alkaline Phos 33 - 115 U/L 141(H) - 130(H)  AST 10 - 35 U/L 54(H) - 68(H)  ALT 6 - 29 U/L 22 - 23   AFP = 6 on 04/11/2015    Radiologic studies:  Moderate ascites in mesentery on CT 04/27/15. I personally reviewed those images   @ASSESSMENTPLANBEGIN @ Assessment: Encounter Diagnoses  Name Primary?  . Alcoholic cirrhosis of liver with ascites ([place]) Yes  . Abnormal transaminases   . Tobacco abuse   . Secondary esophageal varices without bleeding (HCC)     Overall, she has remained clinically stable since I first met her earlier this year. Her weight has been stable over that time and her ascites is primarily in the mesentery and therefore  not amenable to paracentesis. It is not large volume ascites by exam.  Plan: I filled her prescription for Lasix 40 mg once a day. She will also continue Aldactone 25 mg a day, which is limited by her relative hypotension. If she feels lightheaded on the 40 mg Lasix dose, she will take a half tablet once a day. I sent her for some updated labs today She will see me in 4 months, and at that time will need a Pneumovax.  Total 35 minutes time, over half spent reviewing her records counseling and coordinating care. We discussed issues related to her assisted ascites screening for varices and  hepatoma. She was strongly encouraged to see her primary care physician regarding ongoing anxiety. I think she is probably having some IBS like symptoms from the anxiety  Nelida Meuse III

## 2015-07-15 NOTE — Patient Instructions (Addendum)
If you are age 50 or older, your body mass index should be between 23-30. Your Body mass index is 26.79 kg/(m^2). If this is out of the aforementioned range listed, please consider follow up with your Primary Care Provider.  If you are age 50 or younger, your body mass index should be between 19-25. Your Body mass index is 26.79 kg/(m^2). If this is out of the aformentioned range listed, please consider follow up with your Primary Care Provider.   Your physician has requested that you go to the basement for the following lab work before leaving today: cbc,cmet and pt/inr  Please follow up in 4 months in clinic. (Oct 2016)  Thank you for choosing [facility]  Dr Amada JupiterHenry Danis III

## 2015-08-04 MED FILL — TRIAMCINOLONE 0.1% CREAM: 0.1 | 15 days supply | Qty: 30 | Fill #1

## 2015-08-04 MED FILL — METHOCARBAMOL 500 MG TABLET: 500 | 30 days supply | Qty: 90 | Fill #0

## 2015-08-04 MED FILL — FUROSEMIDE 20 MG TABLET: 20 | 20 days supply | Qty: 40 | Fill #0

## 2015-08-04 MED FILL — ?PANTOPRAZOLE SOD DR 40MG: 40 MG | 30 days supply | Qty: 30 | Fill #1

## 2015-08-04 MED FILL — SPIRONOLACTONE 25 MG TABLET: 25 | 30 days supply | Qty: 30 | Fill #1

## 2015-08-11 ENCOUNTER — Ambulatory Visit: Payer: Medicaid Other | Attending: Family Medicine

## 2015-08-12 ENCOUNTER — Ambulatory Visit: Payer: Medicaid Other | Attending: Family Medicine | Admitting: Family Medicine

## 2015-08-12 ENCOUNTER — Encounter: Payer: Self-pay | Admitting: Family Medicine

## 2015-08-12 VITALS — BP 97/60 | HR 81 | Temp 98.5°F | Resp 14 | Ht 65.5 in | Wt 161.2 lb

## 2015-08-12 DIAGNOSIS — M5441 Lumbago with sciatica, right side: Secondary | ICD-10-CM | POA: Diagnosis not present

## 2015-08-12 DIAGNOSIS — K7031 Alcoholic cirrhosis of liver with ascites: Secondary | ICD-10-CM

## 2015-08-12 DIAGNOSIS — F2089 Other schizophrenia: Secondary | ICD-10-CM

## 2015-08-12 DIAGNOSIS — L409 Psoriasis, unspecified: Secondary | ICD-10-CM | POA: Diagnosis not present

## 2015-08-12 DIAGNOSIS — F411 Generalized anxiety disorder: Secondary | ICD-10-CM

## 2015-08-12 DIAGNOSIS — M2550 Pain in unspecified joint: Secondary | ICD-10-CM

## 2015-08-12 MED ORDER — TRIAMCINOLONE ACETONIDE 0.1 % EX CREA: TOPICAL_CREAM | CUTANEOUS | Status: DC

## 2015-08-12 MED ORDER — METHOCARBAMOL 500 MG PO TABS: 500.0000 mg | ORAL_TABLET | Freq: Three times a day (TID) | ORAL | Status: DC | PRN

## 2015-08-12 MED ORDER — MELOXICAM 7.5 MG PO TABS: 7.5000 mg | ORAL_TABLET | Freq: Every day | ORAL | Status: DC

## 2015-08-12 MED ORDER — PANTOPRAZOLE SODIUM 40 MG PO TBEC: 40.0000 mg | DELAYED_RELEASE_TABLET | Freq: Every day | ORAL | Status: DC

## 2015-08-12 MED ORDER — IPRATROPIUM-ALBUTEROL 18-103 MCG/ACT IN AERO: 2.0000 | INHALATION_SPRAY | RESPIRATORY_TRACT | Status: AC | PRN

## 2015-08-12 MED ORDER — ACETAMINOPHEN-CODEINE #3 300-30 MG PO TABS: 1.0000 | ORAL_TABLET | Freq: Two times a day (BID) | ORAL | Status: DC | PRN

## 2015-08-12 MED ORDER — SPIRONOLACTONE 25 MG PO TABS: 25.0000 mg | ORAL_TABLET | Freq: Every day | ORAL | Status: DC

## 2015-08-12 NOTE — Progress Notes (Signed)
Subjective:  Patient ID: Janet Tyler, female    DOB: April 18, 1965  Age: 50 y.o. MRN: [id number]  CC: Follow-up   HPI Janet Tyler is a 50 year old female with a history of alcoholic liver cirrhosis who comes into the clinic for a follow-up visit.  She was recently seen by [facility] on 07/15/15; CT abdomen and pelvis revealed hepatic cirrhosis with evidence of portal venous hypertension, mild splenomegaly, mild esophageal varices, moderate ascites. Upper endoscopy revealed grade 1 varices in lower third of esophagus.  Complains of pain in her lower back, neck, shoulders, ankles  and cannot seem to get comfortable when lying down. Complains that tramadol does not help her symptoms. Also complains of anxiety and panic attack and had chart reveals a history of schizophrenia for which she had been seen by the LCSW and we had recommended following up with [facility] however the patient failed to do so. States she feels funny sensation on her skin all over.  Outpatient Prescriptions Prior to Visit  Medication Sig Dispense Refill  . furosemide (LASIX) 20 MG tablet Take 2 tablets (40 mg total) by mouth daily. Take 1 extra pill with severe pedal edema. 40 tablet 4  . Multiple Vitamins-Minerals (MULTIVITAMIN GUMMIES WOMENS PO) Take 2 capsules by mouth once.    Marland Kitchen albuterol (PROVENTIL HFA;VENTOLIN HFA) 108 (90 Base) MCG/ACT inhaler Inhale 2 puffs into the lungs every 6 (six) hours as needed for wheezing or shortness of breath. 1 Inhaler 0  . albuterol-ipratropium (COMBIVENT) 18-103 MCG/ACT inhaler Inhale 2 puffs into the lungs every 4 (four) hours as needed for wheezing or shortness of breath. Reported on 04/28/2015    . guaiFENesin-codeine (ROBITUSSIN AC) 100-10 MG/5ML syrup Take 10 mLs by mouth 4 (four) times daily as needed for cough. 180 mL 0  . methocarbamol (ROBAXIN) 500 MG tablet Take 1 tablet (500 mg total) by mouth every 8 (eight) hours as needed for muscle spasms. 90 tablet 1  . pantoprazole  (PROTONIX) 40 MG tablet Take 1 tablet (40 mg total) by mouth daily. 30 tablet 1  . spironolactone (ALDACTONE) 25 MG tablet Take 1 tablet (25 mg total) by mouth daily. 30 tablet 3  . traMADol (ULTRAM) 50 MG tablet Take 1 tablet (50 mg total) by mouth every 12 (twelve) hours as needed. 40 tablet 1  . triamcinolone cream (KENALOG) 0.1 % APPLY 1 APPLICATION TOPICALLY 2 TIMES DAILY. 30 g 0  . LORazepam (ATIVAN PO) Take 1 tablet by mouth daily as needed (anxiety). Reported on 08/12/2015    . OVER THE COUNTER MEDICATION Take 2 tablets by mouth every 14 (fourteen) days.     Marland Kitchen amoxicillin (AMOXIL) 500 MG capsule Take 1 capsule (500 mg total) by mouth 3 (three) times daily. (Patient not taking: Reported on 08/12/2015) 30 capsule 0   No facility-administered medications prior to visit.    ROS Review of Systems Constitutional: Negative for activity change, appetite change and fatigue.  HENT: Negative for congestion, sinus pressure and sore throat, dental complaints  Eyes: Positive for visual disturbance.  Respiratory: Negative for cough, chest tightness, shortness of breath and wheezing.   Cardiovascular: Negative for chest pain and palpitations.  Gastrointestinal: Positive for abdominal distention. Negative for abdominal pain and constipation.  Endocrine: Negative for polydipsia.  Genitourinary: Negative for dysuria and frequency.  Musculoskeletal: Positive for back pain , ankle and shoulder pain Skin: Negative Neurological: Negative for tremors, light-headedness and numbness.  Hematological: Does not bruise/bleed easily.  Psychiatric/Behavioral: Negative for behavioral problems and agitation,  positive for anxiety.   Objective:  BP 97/60 mmHg  Pulse 81  Temp(Src) 98.5 F (36.9 C) (Oral)  Resp 14  Ht 5' 5.5" (1.664 m)  Wt 161 lb 3.2 oz (73.12 kg)  BMI 26.41 kg/m2  SpO2 99%  BP/Weight 08/12/2015 07/15/2015 04/28/2015  Systolic BP 97 100 102  Diastolic BP 60 64 66  Wt. (Lbs) 161.2 161 160.2    BMI 26.41 26.79 26.66      Physical Exam Constitutional: Patient appears well-developed and well-nourished. No distress. CVS: normal rate and rhythm , S1/S2 +, no murmurs, no gallops, no carotid bruit.  Pulmonary: Effort and breath sounds normal, no stridor, rhonchi, wheezes, rales.  Abdominal: Umbilical hernia with mild ascites  Musculoskeletal: Normal range of motion. Bilateral 1+pedal edema. Lymphadenopathy: No lymphadenopathy noted, cervical, inguinal or axillary Neuro: Alert. Normal reflexes, muscle tone coordination. No cranial nerve deficit. Skin:  varicose veins on feet Psychiatric: Normal mood and affect. Behavior, judgment, thought content normal.   CMP Latest Ref Rng 07/15/2015 03/10/2015 02/03/2015  Glucose 70 - 99 mg/dL 84 82 960(A106(H)  BUN 6 - 23 mg/dL 8 5(L) 3(L)  Creatinine 0.40 - 1.20 mg/dL 5.400.60 9.81(X0.48(L) 9.140.51  Sodium 135 - 145 mEq/L 135 131(L) 134(L)  Potassium 3.5 - 5.1 mEq/L 3.9 4.9 4.7  Chloride 96 - 112 mEq/L 104 101 106  CO2 19 - 32 mEq/L 28 24 23   Calcium 8.4 - 10.5 mg/dL 9.4 9.0 7.8(G8.4(L)  Total Protein 6.0 - 8.3 g/dL 6.9 7.0 -  Total Bilirubin 0.2 - 1.2 mg/dL 9.5(A1.6(H) 2.3(H) -  Alkaline Phos 39 - 117 U/L 135(H) 141(H) -  AST 0 - 37 U/L 52(H) 54(H) -  ALT 0 - 35 U/L 25 22 -     CBC    Component Value Date/Time   WBC 4.8 07/15/2015 1244   RBC 3.44* 07/15/2015 1244   HGB 11.2* 07/15/2015 1244   HCT 32.2* 07/15/2015 1244   PLT 103.0* 07/15/2015 1244   MCV 93.6 07/15/2015 1244   MCH 33.0 03/10/2015 1100   MCHC 34.7 07/15/2015 1244   RDW 15.8* 07/15/2015 1244   LYMPHSABS 1.6 07/15/2015 1244   MONOABS 0.8 07/15/2015 1244   EOSABS 0.2 07/15/2015 1244   BASOSABS 0.0 07/15/2015 1244      Assessment & Plan:   1. Alcoholic cirrhosis of liver with ascites (HCC) Patient quit alcohol consumption - spironolactone (ALDACTONE) 25 MG tablet; Take 1 tablet (25 mg total) by mouth daily.  Dispense: 30 tablet; Refill: 3 - pantoprazole (PROTONIX) 40 MG tablet; Take 1  tablet (40 mg total) by mouth daily.  Dispense: 30 tablet; Refill: 3  2. Bilateral low back pain with right-sided sciatica Advised to apply heat - methocarbamol (ROBAXIN) 500 MG tablet; Take 1 tablet (500 mg total) by mouth every 8 (eight) hours as needed for muscle spasms.  Dispense: 90 tablet; Refill: 3 - acetaminophen-codeine (TYLENOL #3) 300-30 MG tablet; Take 1 tablet by mouth every 12 (twelve) hours as needed for moderate pain.  Dispense: 40 tablet; Refill: 1 - DG Lumbar Spine Complete; Future  3. Other schizophrenia (HCC) Advised she would need to follow-up with mental health especially with onset of anxiety  4. Psoriasis - triamcinolone cream (KENALOG) 0.1 %; APPLY 1 APPLICATION TOPICALLY 2 TIMES DAILY.  Dispense: 30 g; Refill: 1  5. Anxiety state We'll hold off on treating just anxiety as she will need care for her schizophrenia as well. [facility] and she knows she can walk into the clinic  6. Arthralgia Cannot exclude underlying osteoarthritis - meloxicam (MOBIC) 7.5 MG tablet; Take 1 tablet (7.5 mg total) by mouth daily.  Dispense: 30 tablet; Refill: 2   Meds ordered this encounter  Medications  . spironolactone (ALDACTONE) 25 MG tablet    Sig: Take 1 tablet (25 mg total) by mouth daily.    Dispense:  30 tablet    Refill:  3  . pantoprazole (PROTONIX) 40 MG tablet    Sig: Take 1 tablet (40 mg total) by mouth daily.    Dispense:  30 tablet    Refill:  3  . methocarbamol (ROBAXIN) 500 MG tablet    Sig: Take 1 tablet (500 mg total) by mouth every 8 (eight) hours as needed for muscle spasms.    Dispense:  90 tablet    Refill:  3  . albuterol-ipratropium (COMBIVENT) 18-103 MCG/ACT inhaler    Sig: Inhale 2 puffs into the lungs every 4 (four) hours as needed for wheezing or shortness of breath. Reported on 04/28/2015    Dispense:  1 Inhaler    Refill:  3  . acetaminophen-codeine (TYLENOL #3) 300-30 MG tablet    Sig: Take 1 tablet by mouth every 12  (twelve) hours as needed for moderate pain.    Dispense:  40 tablet    Refill:  1  . triamcinolone cream (KENALOG) 0.1 %    Sig: APPLY 1 APPLICATION TOPICALLY 2 TIMES DAILY.    Dispense:  30 g    Refill:  1  . meloxicam (MOBIC) 7.5 MG tablet    Sig: Take 1 tablet (7.5 mg total) by mouth daily.    Dispense:  30 tablet    Refill:  2    Follow-up: Return in about 3 months (around 11/12/2015) for follow up of liver cirrhosis.   Jaclyn Shaggy MD

## 2015-08-12 NOTE — Progress Notes (Signed)
Pt here for F/U. Pt reports pain in her joints, back and hips rated at a 3. Pt has not taken medications today. Pt also states she needs refills.

## 2015-08-12 NOTE — Patient Instructions (Signed)

## 2015-08-24 MED FILL — MELOXICAM 7.5 MG TABLET: 7.5 | 30 days supply | Qty: 30 | Fill #0

## 2015-08-24 MED FILL — ?FUROSEMIDE 20 MG TABLET: 20 | 20 days supply | Qty: 40 | Fill #1

## 2015-08-24 MED FILL — !COMBIVENT RESPIMAT INHAL S: 20-100 MCG | 30 days supply | Qty: 4 | Fill #0

## 2015-08-24 MED FILL — TRIAMCINOLONE 0.1% CREAM: 0.1 | 20 days supply | Qty: 30 | Fill #0

## 2015-08-31 MED FILL — ACETAMINOPHEN/COD #3 TABLET: 300-30 | 40 days supply | Qty: 40 | Fill #0

## 2015-10-01 ENCOUNTER — Encounter: Payer: Self-pay | Admitting: Gastroenterology

## 2015-12-31 ENCOUNTER — Other Ambulatory Visit: Payer: Self-pay | Admitting: Family Medicine

## 2015-12-31 DIAGNOSIS — K7031 Alcoholic cirrhosis of liver with ascites: Secondary | ICD-10-CM

## 2016-02-17 ENCOUNTER — Other Ambulatory Visit: Payer: Self-pay | Admitting: Family Medicine

## 2016-02-17 DIAGNOSIS — K7031 Alcoholic cirrhosis of liver with ascites: Secondary | ICD-10-CM

## 2016-03-21 ENCOUNTER — Ambulatory Visit: Payer: Medicaid Other | Attending: Family Medicine | Admitting: Family Medicine

## 2016-03-21 ENCOUNTER — Encounter: Payer: Self-pay | Admitting: Family Medicine

## 2016-03-21 ENCOUNTER — Other Ambulatory Visit: Payer: Self-pay

## 2016-03-21 VITALS — BP 97/55 | HR 83 | Temp 98.0°F | Ht 66.0 in | Wt 162.0 lb

## 2016-03-21 DIAGNOSIS — I4581 Long QT syndrome: Secondary | ICD-10-CM | POA: Insufficient documentation

## 2016-03-21 DIAGNOSIS — I8392 Asymptomatic varicose veins of left lower extremity: Secondary | ICD-10-CM

## 2016-03-21 DIAGNOSIS — K7031 Alcoholic cirrhosis of liver with ascites: Secondary | ICD-10-CM

## 2016-03-21 DIAGNOSIS — M419 Scoliosis, unspecified: Secondary | ICD-10-CM | POA: Insufficient documentation

## 2016-03-21 DIAGNOSIS — G8929 Other chronic pain: Secondary | ICD-10-CM | POA: Diagnosis not present

## 2016-03-21 DIAGNOSIS — M21612 Bunion of left foot: Secondary | ICD-10-CM | POA: Insufficient documentation

## 2016-03-21 DIAGNOSIS — F329 Major depressive disorder, single episode, unspecified: Secondary | ICD-10-CM | POA: Insufficient documentation

## 2016-03-21 DIAGNOSIS — R600 Localized edema: Secondary | ICD-10-CM | POA: Insufficient documentation

## 2016-03-21 DIAGNOSIS — F419 Anxiety disorder, unspecified: Secondary | ICD-10-CM | POA: Insufficient documentation

## 2016-03-21 DIAGNOSIS — I8393 Asymptomatic varicose veins of bilateral lower extremities: Secondary | ICD-10-CM | POA: Insufficient documentation

## 2016-03-21 DIAGNOSIS — M545 Low back pain: Secondary | ICD-10-CM | POA: Diagnosis not present

## 2016-03-21 DIAGNOSIS — K219 Gastro-esophageal reflux disease without esophagitis: Secondary | ICD-10-CM | POA: Insufficient documentation

## 2016-03-21 DIAGNOSIS — F1910 Other psychoactive substance abuse, uncomplicated: Secondary | ICD-10-CM | POA: Insufficient documentation

## 2016-03-21 DIAGNOSIS — M542 Cervicalgia: Secondary | ICD-10-CM | POA: Insufficient documentation

## 2016-03-21 DIAGNOSIS — R002 Palpitations: Secondary | ICD-10-CM | POA: Diagnosis present

## 2016-03-21 DIAGNOSIS — M21611 Bunion of right foot: Secondary | ICD-10-CM | POA: Diagnosis not present

## 2016-03-21 DIAGNOSIS — J449 Chronic obstructive pulmonary disease, unspecified: Secondary | ICD-10-CM | POA: Insufficient documentation

## 2016-03-21 DIAGNOSIS — M62838 Other muscle spasm: Secondary | ICD-10-CM | POA: Diagnosis not present

## 2016-03-21 DIAGNOSIS — M2550 Pain in unspecified joint: Secondary | ICD-10-CM

## 2016-03-21 DIAGNOSIS — M5441 Lumbago with sciatica, right side: Secondary | ICD-10-CM | POA: Diagnosis not present

## 2016-03-21 LAB — CBC WITH DIFFERENTIAL/PLATELET
BASOS PCT: 1 %
Basophils Absolute: 46 cells/uL (ref 0–200)
Eosinophils Absolute: 138 cells/uL (ref 15–500)
Eosinophils Relative: 3 %
HCT: 34.7 % — ABNORMAL LOW (ref 35.0–45.0)
Hemoglobin: 11.6 g/dL — ABNORMAL LOW (ref 11.7–15.5)
LYMPHS PCT: 38 %
Lymphs Abs: 1748 cells/uL (ref 850–3900)
MCH: 29.7 pg (ref 27.0–33.0)
MCHC: 33.4 g/dL (ref 32.0–36.0)
MCV: 89 fL (ref 80.0–100.0)
MONOS PCT: 14 %
MPV: 10.1 fL (ref 7.5–12.5)
Monocytes Absolute: 644 cells/uL (ref 200–950)
NEUTROS PCT: 44 %
Neutro Abs: 2024 cells/uL (ref 1500–7800)
PLATELETS: 137 10*3/uL — AB (ref 140–400)
RBC: 3.9 MIL/uL (ref 3.80–5.10)
RDW: 15.2 % — AB (ref 11.0–15.0)
WBC: 4.6 10*3/uL (ref 3.8–10.8)

## 2016-03-21 LAB — COMPLETE METABOLIC PANEL WITH GFR
ALBUMIN: 3.9 g/dL (ref 3.6–5.1)
ALT: 22 U/L (ref 6–29)
AST: 35 U/L (ref 10–35)
Alkaline Phosphatase: 145 U/L — ABNORMAL HIGH (ref 33–130)
BILIRUBIN TOTAL: 0.9 mg/dL (ref 0.2–1.2)
BUN: 9 mg/dL (ref 7–25)
CALCIUM: 9.5 mg/dL (ref 8.6–10.4)
CO2: 25 mmol/L (ref 20–31)
CREATININE: 0.88 mg/dL (ref 0.50–1.05)
Chloride: 101 mmol/L (ref 98–110)
GFR, EST AFRICAN AMERICAN: 89 mL/min (ref >=60)
GFR, Est Non African American: 77 mL/min (ref >=60)
Glucose, Bld: 89 mg/dL (ref 65–99)
Potassium: 3.9 mmol/L (ref 3.5–5.3)
Sodium: 134 mmol/L — ABNORMAL LOW (ref 135–146)
TOTAL PROTEIN: 7.4 g/dL (ref 6.1–8.1)

## 2016-03-21 MED ORDER — PANTOPRAZOLE SODIUM 40 MG PO TBEC: 40.0000 mg | DELAYED_RELEASE_TABLET | Freq: Every day | ORAL | 5 refills | Status: DC

## 2016-03-21 MED ORDER — MELOXICAM 7.5 MG PO TABS: 7.5000 mg | ORAL_TABLET | Freq: Every day | ORAL | 5 refills | Status: DC

## 2016-03-21 MED ORDER — METHOCARBAMOL 500 MG PO TABS: 500.0000 mg | ORAL_TABLET | Freq: Three times a day (TID) | ORAL | 5 refills | Status: DC | PRN

## 2016-03-21 MED ORDER — FUROSEMIDE 40 MG PO TABS: 40.0000 mg | ORAL_TABLET | Freq: Every day | ORAL | 5 refills | Status: DC

## 2016-03-21 MED ORDER — SPIRONOLACTONE 25 MG PO TABS
25.0000 mg | ORAL_TABLET | Freq: Every day | ORAL | 5 refills | Status: DC
Start: 2016-03-21 — End: 2016-10-18

## 2016-03-21 MED ORDER — DULOXETINE HCL 60 MG PO CPEP: 60.0000 mg | ORAL_CAPSULE | Freq: Every day | ORAL | 5 refills | Status: DC

## 2016-03-21 NOTE — Patient Instructions (Signed)
Cirrhosis °Cirrhosis is long-term (chronic) liver injury. The liver is your largest internal organ, and it performs many functions. The liver converts food into energy, removes toxic material from your blood, makes important proteins, and absorbs necessary vitamins from your diet. °If you have cirrhosis, it means many of your healthy liver cells have been replaced by scar tissue. This prevents blood from flowing through your liver, which makes it difficult for your liver to function. This scarring is not reversible, but treatment can prevent it from getting worse. °What are the causes? °Hepatitis C and long-term alcohol abuse are the most common causes of cirrhosis. Other causes include: °· Nonalcoholic fatty liver disease. °· Hepatitis B infection. °· Autoimmune hepatitis. °· Diseases that cause blockage of ducts inside the liver. °· Inherited liver diseases. °· Reactions to certain long-term medicines. °· Parasitic infections. °· Long-term exposure to certain toxins. °What increases the risk? °You may have a higher risk of cirrhosis if you: °· Have certain hepatitis viruses. °· Abuse alcohol, especially if you are female. °· Are overweight. °· Share needles. °· Have unprotected sex with someone who has hepatitis. °What are the signs or symptoms? °You may not have any signs and symptoms at first. Symptoms may not develop until the damage to your liver starts to get worse. Signs and symptoms of cirrhosis may include: °· Tenderness in the right-upper part of your abdomen. °· Weakness and tiredness (fatigue). °· Loss of appetite. °· Nausea. °· Weight loss and muscle loss. °· Itchiness. °· Yellow skin and eyes (jaundice). °· Buildup of fluid in the abdomen (ascites). °· Swelling of the feet and ankles (edema). °· Appearance of tiny blood vessels under the skin. °· Mental confusion. °· Easy bruising and bleeding. °How is this diagnosed? °Your health care provider may suspect cirrhosis based on your symptoms and medical  history, especially if you have other medical conditions or a history of alcohol abuse. Your health care provider will do a physical exam to feel your liver and check for signs of cirrhosis. Your health care provider may perform other tests, including: °· Blood tests to check: °¨ Whether you have hepatitis B or C. °¨ Kidney function. °¨ Liver function. °· Imaging tests such as: °¨ MRI or CT scan to look for changes seen in advanced cirrhosis. °¨ Ultrasound to see if normal liver tissue is being replaced by scar tissue. °· A procedure using a long needle to take a sample of liver tissue (biopsy) for examination under a microscope. Liver biopsy can confirm the diagnosis of cirrhosis. °How is this treated? °Treatment depends on how damaged your liver is and what caused the damage. Treatment may include treating cirrhosis symptoms or treating the underlying causes of the condition to try to slow the progression of the damage. Treatment may include: °· Making lifestyle changes, such as: °¨ Eating a healthy diet. °¨ Restricting salt intake. °¨ Maintaining a healthy weight. °¨ Not abusing drugs or alcohol. °· Taking medicines to: °¨ Treat liver infections or other infections. °¨ Control itching. °¨ Reduce fluid buildup. °¨ Reduce certain blood toxins. °¨ Reduce risk of bleeding from enlarged blood vessels in the stomach or esophagus (varices). °· If varices are causing bleeding problems, you may need treatment with a procedure that ties up the vessels causing them to fall off (band ligation). °· If cirrhosis is causing your liver to fail, your health care provider may recommend a liver transplant. °· Other treatments may be recommended depending on any complications of cirrhosis, such as   liver-related kidney failure (hepatorenal syndrome). °Follow these instructions at home: °· Take medicines only as directed by your health care provider. Do not use drugs that are toxic to your liver. Ask your health care provider before  taking any new medicines, including over-the-counter medicines. °· Rest as needed. °· Eat a well-balanced diet. Ask your health care provider or dietitian for more information. °· You may have to follow a low-salt diet or restrict your water intake as directed. °· Do not drink alcohol. This is especially important if you are taking acetaminophen. °· Keep all follow-up visits as directed by your health care provider. This is important. °Contact a health care provider if: °· You have fatigue or weakness that is getting worse. °· You develop swelling of the hands, feet, legs, or face. °· You have a fever. °· You develop loss of appetite. °· You have nausea or vomiting. °· You develop jaundice. °· You develop easy bruising or bleeding. °Get help right away if: °· You vomit bright red blood or a material that looks like coffee grounds. °· You have blood in your stools. °· Your stools appear black and tarry. °· You become confused. °· You have chest pain or trouble breathing. °This information is not intended to replace advice given to you by your health care provider. Make sure you discuss any questions you have with your health care provider. °Document Released: 01/09/2005 Document Revised: 05/20/2015 Document Reviewed: 09/17/2013 °Elsevier Interactive Patient Education © 2017 [employer] ° °

## 2016-03-21 NOTE — Progress Notes (Signed)
Subjective:  Patient ID: Janet Tyler, female    DOB: November 18, 1965  Age: 51 y.o. MRN: [id number]  CC: alcoholic cirrhosis of liver; painful joints; red bump on lower right lid; body cramps (called 911 in the middle of the night); Back Pain (lower left side); and irregular heart rate   HPI Janet Tyler is a 51 year old female with a history of alcoholic liver cirrhosis who comes into the clinic for a follow-up visit; last office visit was 7 months ago.  [facility] on [facility]; CT abdomen and pelvis revealed hepatic cirrhosis with evidence of portal venous hypertension, mild splenomegaly, mild esophageal varices, moderate ascites. Upper endoscopy revealed grade 1 varices in lower third of esophagus.  Complains of pain in her lower back, neck, shoulders, ankles  and cannot seem to get comfortable when lying down, had muscle cramps recently and had to call 911. (Of note she had run out of her Robaxin)  She complains of painful varicose veins in both lower extremities don't like to have definitive management for this. Also complains of intermittent heart fluttering, occasional tremors. States she quit alcohol in 11/2014. She complains of pedal edema despite compliance with her Lasix and spironolactone.  Concerned about post peak toes being laterally deviated and would like to have that fixed.  Past Medical History:  Diagnosis Date  . Anxiety   . Arthritis    "Scoliosis"- problem lying on right  side, needs legs elevated.  . Asthma   . Cirrhosis (HCC)    cirrhosis with fluid retention and abdominal swelling. hx. ETOH abuse"  . COPD (chronic obstructive pulmonary disease) (HCC)   . Depression   . GERD (gastroesophageal reflux disease)   . History of palpitations    "heart flutters all over the place"  . Low blood pressure reading    usual  . Scoliosis   . Substance abuse   . Varicose veins of both legs with edema     Past Surgical History:  Procedure Laterality  Date  . ESOPHAGOGASTRODUODENOSCOPY (EGD) WITH PROPOFOL N/A 05/18/2015   Procedure: ESOPHAGOGASTRODUODENOSCOPY (EGD) WITH PROPOFOL;  Surgeon: Sherrilyn Rist, MD;  Location: [facility];  Service: Gastroenterology;  Laterality: N/A;  . TUBAL LIGATION      No Known Allergies   Outpatient Medications Prior to Visit  Medication Sig Dispense Refill  . albuterol-ipratropium (COMBIVENT) 18-103 MCG/ACT inhaler Inhale 2 puffs into the lungs every 4 (four) hours as needed for wheezing or shortness of breath. Reported on 04/28/2015 1 Inhaler 3  . triamcinolone cream (KENALOG) 0.1 % APPLY 1 APPLICATION TOPICALLY 2 TIMES DAILY. 30 g 1  . furosemide (LASIX) 40 MG tablet TAKE ONE TABLET BY MOUTH ONCE DAILY 30 tablet 0  . meloxicam (MOBIC) 7.5 MG tablet Take 1 tablet (7.5 mg total) by mouth daily. 30 tablet 2  . pantoprazole (PROTONIX) 40 MG tablet Take 1 tablet (40 mg total) by mouth daily. 30 tablet 3  . spironolactone (ALDACTONE) 25 MG tablet TAKE ONE TABLET BY MOUTH ONCE DAILY 30 tablet 0  . LORazepam (ATIVAN PO) Take 1 tablet by mouth daily as needed (anxiety). Reported on 08/12/2015    . Multiple Vitamins-Minerals (MULTIVITAMIN GUMMIES WOMENS PO) Take 2 capsules by mouth once.    Marland Kitchen OVER THE COUNTER MEDICATION Take 2 tablets by mouth every 14 (fourteen) days.     Marland Kitchen acetaminophen-codeine (TYLENOL #3) 300-30 MG tablet Take 1 tablet by mouth every 12 (twelve) hours as needed for moderate pain. (Patient not taking:  Reported on 03/21/2016) 40 tablet 1  . methocarbamol (ROBAXIN) 500 MG tablet Take 1 tablet (500 mg total) by mouth every 8 (eight) hours as needed for muscle spasms. (Patient not taking: Reported on 03/21/2016) 90 tablet 3  . pantoprazole (PROTONIX) 40 MG tablet TAKE ONE TABLET BY MOUTH ONCE DAILY 30 tablet 1   No facility-administered medications prior to visit.     ROS Review of Systems  Constitutional: Negative for activity change, appetite change and fatigue.  HENT: Negative for  congestion, sinus pressure and sore throat.   Eyes: Negative for visual disturbance.  Respiratory: Negative for cough, chest tightness, shortness of breath and wheezing.   Cardiovascular: Positive for palpitations and leg swelling. Negative for chest pain.  Gastrointestinal: Negative for abdominal distention, abdominal pain and constipation.  Endocrine: Negative for polydipsia.  Genitourinary: Negative for dysuria and frequency.  Musculoskeletal: Positive for arthralgias, back pain and myalgias.  Skin: Negative for rash.  Neurological: Positive for tremors. Negative for light-headedness and numbness.  Hematological: Does not bruise/bleed easily.  Psychiatric/Behavioral: Negative for agitation and behavioral problems.    Objective:  BP (!) 97/55 (BP Location: Right Arm, Patient Position: Sitting, Cuff Size: Small)   Pulse 83   Temp 98 F (36.7 C) (Oral)   Ht 5\' 6"  (1.676 m)   Wt 162 lb (73.5 kg)   SpO2 100%   BMI 26.15 kg/m   BP/Weight 03/21/2016 08/12/2015 07/15/2015  Systolic BP 97 97 100  Diastolic BP 55 60 64  Wt. (Lbs) 162 161.2 161  BMI 26.15 26.41 26.79      Physical Exam  Constitutional: She is oriented to person, place, and time. She appears well-developed and well-nourished.  Cardiovascular: Normal rate, normal heart sounds and intact distal pulses.   No murmur heard. Pulmonary/Chest: Effort normal and breath sounds normal. She has no wheezes. She has no rales. She exhibits no tenderness.  Abdominal: Soft. Bowel sounds are normal. She exhibits no distension and no mass. There is no tenderness.  Musculoskeletal: Normal range of motion. She exhibits edema (2+ bilateral pitting pedal edema).  Neurological: She is alert and oriented to person, place, and time.  Skin: Skin is warm and dry.  Varicose veins in intact bilateral lower extremity  Psychiatric: She has a normal mood and affect.     Assessment & Plan:   1. Alcoholic cirrhosis of liver with ascites  (HCC) Quit alcohol in 11/2014 She complains of persisting pedal edema but blood pressure is of the soft side so I am unable to titrate up the dose of spironolactone. - spironolactone (ALDACTONE) 25 MG tablet; Take 1 tablet (25 mg total) by mouth daily.  Dispense: 30 tablet; Refill: 5 - pantoprazole (PROTONIX) 40 MG tablet; Take 1 tablet (40 mg total) by mouth daily.  Dispense: 30 tablet; Refill: 5 - furosemide (LASIX) 40 MG tablet; Take 1 tablet (40 mg total) by mouth daily.  Dispense: 30 tablet; Refill: 5 - COMPLETE METABOLIC PANEL WITH GFR - CBC with Differential/Platelet - Protime-INR  2. Arthralgia, unspecified joint She does have polyarthralgia; cannot exclude underlying osteoarthritis - meloxicam (MOBIC) 7.5 MG tablet; Take 1 tablet (7.5 mg total) by mouth daily.  Dispense: 30 tablet; Refill: 5 - DULoxetine (CYMBALTA) 60 MG capsule; Take 1 capsule (60 mg total) by mouth daily.  Dispense: 30 capsule; Refill: 5  3. Chronic bilateral low back pain with right-sided sciatica She does have occasional muscle spasms but has been out of Robaxin Cymbalta added to regimen, refill Robaxin - methocarbamol (ROBAXIN)  500 MG tablet; Take 1 tablet (500 mg total) by mouth every 8 (eight) hours as needed for muscle spasms.  Dispense: 90 tablet; Refill: 5  4. Palpitations We'll check for underlying thyroid disorder EKG reveals normal sinus rhythm, prolonged QT interval of 492 ms  5. Varicose veins of left lower extremity - Ambulatory referral to Vascular Surgery  6. Bilateral bunions - Ambulatory referral to Podiatry   Meds ordered this encounter  Medications  . spironolactone (ALDACTONE) 25 MG tablet    Sig: Take 1 tablet (25 mg total) by mouth daily.    Dispense:  30 tablet    Refill:  5  . pantoprazole (PROTONIX) 40 MG tablet    Sig: Take 1 tablet (40 mg total) by mouth daily.    Dispense:  30 tablet    Refill:  5  . meloxicam (MOBIC) 7.5 MG tablet    Sig: Take 1 tablet (7.5 mg total)  by mouth daily.    Dispense:  30 tablet    Refill:  5  . methocarbamol (ROBAXIN) 500 MG tablet    Sig: Take 1 tablet (500 mg total) by mouth every 8 (eight) hours as needed for muscle spasms.    Dispense:  90 tablet    Refill:  5  . furosemide (LASIX) 40 MG tablet    Sig: Take 1 tablet (40 mg total) by mouth daily.    Dispense:  30 tablet    Refill:  5    Must have office visit for refills  . DULoxetine (CYMBALTA) 60 MG capsule    Sig: Take 1 capsule (60 mg total) by mouth daily.    Dispense:  30 capsule    Refill:  5    Follow-up: Return in about 3 months (around 06/18/2016) for Follow up on liver cirrhosis.   Jaclyn ShaggyEnobong Amao MD

## 2016-03-23 ENCOUNTER — Telehealth: Payer: Self-pay

## 2016-03-23 LAB — PROTIME-INR
INR: 1.2 — ABNORMAL HIGH
PROTHROMBIN TIME: 12.8 s — AB (ref 9.0–11.5)

## 2016-03-23 NOTE — Telephone Encounter (Signed)
-----   Message from Jaclyn ShaggyEnobong Amao, MD sent at 03/23/2016 11:27 AM EST ----- Labs are stable

## 2016-03-23 NOTE — Telephone Encounter (Signed)
Writer called patient and LVM that her labs were stable and to call back if she has any questions.

## 2016-03-29 ENCOUNTER — Ambulatory Visit (INDEPENDENT_AMBULATORY_CARE_PROVIDER_SITE_OTHER): Payer: Medicaid Other | Admitting: Gastroenterology

## 2016-03-29 ENCOUNTER — Encounter: Payer: Self-pay | Admitting: Gastroenterology

## 2016-03-29 VITALS — BP 100/62 | HR 84 | Ht 66.0 in | Wt 159.6 lb

## 2016-03-29 DIAGNOSIS — K7460 Unspecified cirrhosis of liver: Secondary | ICD-10-CM

## 2016-03-29 DIAGNOSIS — R600 Localized edema: Secondary | ICD-10-CM | POA: Diagnosis not present

## 2016-03-29 DIAGNOSIS — R748 Abnormal levels of other serum enzymes: Secondary | ICD-10-CM | POA: Diagnosis not present

## 2016-03-29 NOTE — Progress Notes (Signed)
[facility] Progress Note  Chief Complaint: Alcoholic cirrhosis  Subjective  History:  Janet Tyler follows up for her cirrhosis for the first time since her June 2017 visit. I'm afraid she was 30 minutes late for the visit today, having written down the appointment time and correctly. I'm glad to say she looks the best I have ever seen her. She says she has been sober since November 2016, and that she is working very hard on a "positive outlook". She is still bothered by intermittent anxiety and she has chronic leg cramps. She is also bothered by varicose veins in hopes to see a specialist for that. She was uninterested in my suggestion of elastic support stockings. He has mild peripheral edema for which she still takes low-dose furosemide and aldactone, and did not want to stop these.  ROS: Cardiovascular:  no chest pain Respiratory: no dyspnea Positive anxiety  The patient's Past Medical, Family and Social History were reviewed and are on file in the EMR.  Objective:  Med list reviewed  Vital signs in last 24 hrs: Vitals:   03/29/16 1634  BP: 100/62  Pulse: 84    Physical Exam  Her color and affect are definitely improved from before.  HEENT: sclera anicteric, oral mucosa moist without lesions  Neck: supple, no thyromegaly, JVD or lymphadenopathy  Cardiac: RRR without murmurs, S1S2 heard, mild pretibial and ankle edema bilaterally Pulm: clear to auscultation bilaterally, normal RR and effort noted  Abdomen: soft, no tenderness, with active bowel sounds. No guarding or palpable hepatosplenomegaly. No distention or bulging flanks  Skin; warm and dry, no jaundice. She has a few small patches of what appears to be psoriasis on her shins.   Recent Labs:  CMP Latest Ref Rng & Units 03/21/2016 07/15/2015 03/10/2015  Glucose 65 - 99 mg/dL 89 84 82  BUN 7 - 25 mg/dL 9 8 5(L)  Creatinine 9.14 - 1.05 mg/dL 7.82 9.56 2.13(Y)  Sodium 135 - 146 mmol/L 134(L) 135 131(L)  Potassium 3.5 -  5.3 mmol/L 3.9 3.9 4.9  Chloride 98 - 110 mmol/L 101 104 101  CO2 20 - 31 mmol/L 25 28 24   Calcium 8.6 - 10.4 mg/dL 9.5 9.4 9.0  Total Protein 6.1 - 8.1 g/dL 7.4 6.9 7.0  Total Bilirubin 0.2 - 1.2 mg/dL 0.9 8.6(V) 2.3(H)  Alkaline Phos 33 - 130 U/L 145(H) 135(H) 141(H)  AST 10 - 35 U/L 35 52(H) 54(H)  ALT 6 - 29 U/L 22 25 22    CBC Latest Ref Rng & Units 03/21/2016 07/15/2015 03/10/2015  WBC 3.8 - 10.8 K/uL 4.6 4.8 5.9  Hemoglobin 11.7 - 15.5 g/dL 11.6(L) 11.2(L) 11.4(L)  Hematocrit 35.0 - 45.0 % 34.7(L) 32.2(L) 32.4(L)  Platelets 140 - 400 K/uL 137(L) 103.0(L) 153   INR 1.2  Radiologic studies: Last abdominal ultrasound one year ago   @ASSESSMENTPLANBEGIN @ Assessment: Encounter Diagnoses  Name Primary?  . Cirrhosis of liver without ascites, unspecified hepatic cirrhosis type (HCC) Yes  . Localized edema   . Abnormal transaminases     I applauded her for her improved health and long period of alcohol abstinence. She has no discernible ascites on exam and only mild peripheral edema. I really feel like she most likely does not need diuretics at this point if she would use support stockings, but she is assistant upon continuing them. She declined a Pneumovax today Plan: Ultrasound to screen for hepatoma I will refill her diuretics as needed See me in 6 months  Total time 25 minutes,  over half spent in counseling and coordination of care.   Charlie PitterHenry L Danis III

## 2016-03-29 NOTE — Patient Instructions (Signed)
If you are age 51 or older, your body mass index should be between 23-30. Your Body mass index is 25.76 kg/m. If this is out of the aforementioned range listed, please consider follow up with your Primary Care Provider.  If you are age 51 or younger, your body mass index should be between 19-25. Your Body mass index is 25.76 kg/m. If this is out of the aformentioned range listed, please consider follow up with your Primary Care Provider.   You have been scheduled for an abdominal ultrasound at [facility] (1st floor of hospital) on 04-04-2016 at 730 am. Please arrive 15 minutes prior to your appointment for registration. Make certain not to have anything to eat or drink 6 hours prior to your appointment. Should you need to reschedule your appointment, please contact radiology at [phone]. This test typically takes about 30 minutes to perform.  Thank you for choosing [facility]  Dr Amada JupiterHenry Danis III

## 2016-04-04 ENCOUNTER — Ambulatory Visit (HOSPITAL_COMMUNITY)
Admission: RE | Admit: 2016-04-04 | Discharge: 2016-04-04 | Disposition: A | Discharge status: Home / Self Care | Payer: Medicaid Other | Source: Ambulatory Visit | Attending: Gastroenterology | Admitting: Gastroenterology

## 2016-04-04 DIAGNOSIS — K7460 Unspecified cirrhosis of liver: Secondary | ICD-10-CM | POA: Diagnosis not present

## 2016-04-04 DIAGNOSIS — K8020 Calculus of gallbladder without cholecystitis without obstruction: Secondary | ICD-10-CM | POA: Insufficient documentation

## 2016-05-01 ENCOUNTER — Encounter: Payer: Self-pay | Admitting: Vascular Surgery

## 2016-05-08 ENCOUNTER — Other Ambulatory Visit: Payer: Self-pay | Admitting: *Deleted

## 2016-05-08 DIAGNOSIS — I83893 Varicose veins of bilateral lower extremities with other complications: Secondary | ICD-10-CM

## 2016-05-10 ENCOUNTER — Encounter: Payer: Self-pay | Admitting: Vascular Surgery

## 2016-05-10 ENCOUNTER — Other Ambulatory Visit: Payer: Self-pay | Admitting: *Deleted

## 2016-05-10 ENCOUNTER — Ambulatory Visit (INDEPENDENT_AMBULATORY_CARE_PROVIDER_SITE_OTHER): Payer: Medicaid Other | Admitting: Vascular Surgery

## 2016-05-10 ENCOUNTER — Ambulatory Visit (HOSPITAL_COMMUNITY)
Admission: RE | Admit: 2016-05-10 | Discharge: 2016-05-10 | Disposition: A | Discharge status: Home / Self Care | Payer: Medicaid Other | Source: Ambulatory Visit | Attending: Vascular Surgery | Admitting: Vascular Surgery

## 2016-05-10 VITALS — BP 106/63 | HR 77 | Temp 98.5°F | Resp 14 | Ht 66.0 in | Wt 160.0 lb

## 2016-05-10 DIAGNOSIS — I83813 Varicose veins of bilateral lower extremities with pain: Secondary | ICD-10-CM | POA: Diagnosis not present

## 2016-05-10 DIAGNOSIS — I83893 Varicose veins of bilateral lower extremities with other complications: Secondary | ICD-10-CM

## 2016-05-10 DIAGNOSIS — I872 Venous insufficiency (chronic) (peripheral): Secondary | ICD-10-CM

## 2016-05-10 NOTE — Progress Notes (Signed)
Referring Physician: [facility]  Patient name: Janet Tyler MRN: [id number] DOB: 19-Aug-1965 Sex: female  REASON FOR CONSULT: Varicose veins with pain lower extremities bilaterally  HPI: Janet Tyler is a 51 y.o. female who complains of pain in both lower extremities with varicose veins. She states that her legs hurt all day long everyday. She complains of itching and burning over the varicosities. She states this is been going on for about a year. She did wear some compression stockings in the past but does not sound she was very compliant with these. She stopped wearing these 4-5 months ago. She states that didn't work. She also has cramps in her lower extremities at night time. She also complains of chronic back pain. Other medical problems include alcoholic cirrhosis which apparently seems to be compensated currently. Review of her lab work showed that she had a normal albumin and normal INR 6 months ago. She is followed by  GI. She has been alcohol free since 2016. She is on disability for her alcohol issues. She denies any prior variceal bleeds.  She does smoke about a half a pack of cigarettes per day. Greater than 3 minutes they spent regarding smoking cessation counseling.  Past Medical History:  Diagnosis Date  . Anxiety   . Arthritis    "Scoliosis"- problem lying on right  side, needs legs elevated.  . Asthma   . Cirrhosis (HCC)    cirrhosis with fluid retention and abdominal swelling. hx. ETOH abuse"  . COPD (chronic obstructive pulmonary disease) (HCC)   . Depression   . GERD (gastroesophageal reflux disease)   . History of palpitations    "heart flutters all over the place"  . Low blood pressure reading    usual  . Scoliosis   . Substance abuse   . Varicose veins of both legs with edema    Past Surgical History:  Procedure Laterality Date  . ESOPHAGOGASTRODUODENOSCOPY (EGD) WITH PROPOFOL N/A 05/18/2015   Procedure: ESOPHAGOGASTRODUODENOSCOPY  (EGD) WITH PROPOFOL;  Surgeon: Sherrilyn Rist, MD;  Location: [facility];  Service: Gastroenterology;  Laterality: N/A;  . TUBAL LIGATION      Family History  Problem Relation Age of Onset  . Arthritis Mother   . Heart disease Mother   . Hypertension Mother   . Stroke Mother     SOCIAL HISTORY: Social History   Social History  . Marital status: Single    Spouse name: N/A  . Number of children: 2  . Years of education: N/A   Occupational History  . Disabled    Social History Main Topics  . Smoking status: Current Every Day Smoker    Packs/day: 0.25    Types: Cigarettes  . Smokeless tobacco: Never Used     Comment: 2 cigs daily  . Alcohol use No     Comment: last drink 12/05/14. Past hx. ETOH abuse  . Drug use: No  . Sexual activity: Not on file   Other Topics Concern  . Not on file   Social History Narrative  . No narrative on file    No Known Allergies  Current Outpatient Prescriptions  Medication Sig Dispense Refill  . albuterol-ipratropium (COMBIVENT) 18-103 MCG/ACT inhaler Inhale 2 puffs into the lungs every 4 (four) hours as needed for wheezing or shortness of breath. Reported on 04/28/2015 1 Inhaler 3  . DULoxetine (CYMBALTA) 60 MG capsule Take 1 capsule (60 mg total) by mouth daily. 30 capsule 5  . furosemide (LASIX)  40 MG tablet Take 1 tablet (40 mg total) by mouth daily. 30 tablet 5  . LORazepam (ATIVAN PO) Take 1 tablet by mouth daily as needed (anxiety). Reported on 08/12/2015    . meloxicam (MOBIC) 7.5 MG tablet Take 1 tablet (7.5 mg total) by mouth daily. 30 tablet 5  . methocarbamol (ROBAXIN) 500 MG tablet Take 1 tablet (500 mg total) by mouth every 8 (eight) hours as needed for muscle spasms. 90 tablet 5  . Multiple Vitamins-Minerals (MULTIVITAMIN GUMMIES WOMENS PO) Take 2 capsules by mouth once.    Marland Kitchen OVER THE COUNTER MEDICATION Take 2 tablets by mouth every 14 (fourteen) days.     . pantoprazole (PROTONIX) 40 MG tablet Take 1 tablet (40 mg total)  by mouth daily. 30 tablet 5  . spironolactone (ALDACTONE) 25 MG tablet Take 1 tablet (25 mg total) by mouth daily. 30 tablet 5  . triamcinolone cream (KENALOG) 0.1 % APPLY 1 APPLICATION TOPICALLY 2 TIMES DAILY. 30 g 1   No current facility-administered medications for this visit.     ROS:   General:  No weight loss, Fever, chills  HEENT: No recent headaches, no nasal bleeding, no visual changes, no sore throat  Neurologic: No dizziness, blackouts, seizures. No recent symptoms of stroke or mini- stroke. No recent episodes of slurred speech, or temporary blindness.  Cardiac: No recent episodes of chest pain/pressure, no shortness of breath at rest.  No shortness of breath with exertion.  Denies history of atrial fibrillation or irregular heartbeat  Vascular: No history of rest pain in feet.  No history of claudication.  No history of non-healing ulcer, No history of DVT   Pulmonary: No home oxygen, no productive cough, no hemoptysis,  No asthma or wheezing  Musculoskeletal:   Arthritis,  Low back pain,   Joint pain  Hematologic:No history of hypercoagulable state.  No history of easy bleeding.  No history of anemia  Gastrointestinal: No hematochezia or melena,  No gastroesophageal reflux, no trouble swallowing  Urinary:  chronic Kidney disease,  on HD -  MWF or  TTHS,  Burning with urination,  Frequent urination,  Difficulty urinating;   Skin: No rashes  Psychological: No history of anxiety,  No history of depression   Physical Examination  Vitals:   05/10/16 1431  BP: 106/63  Pulse: 77  Resp: 14  Temp: 98.5 F (36.9 C)  SpO2: 100%  Weight: 160 lb (72.6 kg)  Height:  (1.676 m)    Body mass index is 25.82 kg/m.  General:  Alert and oriented, no acute distress HEENT: Normal Neck: No bruit or JVD Pulmonary: Clear to auscultation bilaterally Cardiac: Regular Rate and Rhythm without murmur Abdomen: Soft, non-tender, non-distended, no  mass Skin: No rash, diffuse spider-type varicosities both lower extremities. She has a cluster of varicosities for a 5 mm in diameter on the right dorsal foot and right anterior and posterior knee. She also has collection of spider varicosities in the medial thigh bilaterally. She also has 3-4 mm varicosities in the left posterior calf as well as the right knee and right posterior calf. She also has diffuse reticularis in both legs. Extremity Pulses:  2+ radial, brachial, femoral, absent left dorsalis pedis, posterior tibial pulses 2+ right posterior tibial pulse absent right dorsalis pedis pulse  Musculoskeletal: No deformity or edema  Neurologic: Upper and lower extremity motor 5/5 and symmetric  DATA:  CT scan of abdomen and pelvis was reviewed which  showed multiple varicosities periumbilical and some evidence of splenomegaly and hepatic cirrhosis  Patient had a venous duplex ultrasound today which showed reflux in the common femoral saphenofemoral and greater saphenous vein bilaterally. She also had reflux in the right lesser saphenous vein. Vein diameter was 4-6 mm on the right 4-6 mm on the left lesser saphenous diameter was 3 mm bilaterally  ASSESSMENT:  Had a lengthy discussion with the patient today regarding pathophysiology of varicose veins. I did discuss with her that these are not a life-threatening condition on or more of nuisance-type problems. The patient has a constellation of symptoms some of which are probably due to her back pain but some of which also seemed to be due to her varicose veins. I discussed with her that the mainstay of therapy is still going to be compression but we could consider her for laser ablation. Her that she would need to be compliant with her compression stockings for at least 3 months before we would consider that.   PLAN:  Patient was given a prescription today for 20-30 mm compression thigh-high both legs. She will follow-up with [place] in 3 months time for  consideration of laser ablation.   Fabienne Bruns, MD [facility] Office: [phone] Pager: [phone]

## 2016-05-18 ENCOUNTER — Ambulatory Visit (INDEPENDENT_AMBULATORY_CARE_PROVIDER_SITE_OTHER): Payer: Medicaid Other

## 2016-05-18 ENCOUNTER — Ambulatory Visit (INDEPENDENT_AMBULATORY_CARE_PROVIDER_SITE_OTHER): Payer: Medicaid Other | Admitting: Podiatry

## 2016-05-18 ENCOUNTER — Encounter: Payer: Self-pay | Admitting: Podiatry

## 2016-05-18 VITALS — Resp 16 | Ht 66.0 in | Wt 160.0 lb

## 2016-05-18 DIAGNOSIS — M21619 Bunion of unspecified foot: Secondary | ICD-10-CM

## 2016-05-18 DIAGNOSIS — M79672 Pain in left foot: Secondary | ICD-10-CM | POA: Diagnosis not present

## 2016-05-18 DIAGNOSIS — M7751 Other enthesopathy of right foot: Secondary | ICD-10-CM | POA: Diagnosis not present

## 2016-05-18 DIAGNOSIS — M216X9 Other acquired deformities of unspecified foot: Secondary | ICD-10-CM

## 2016-05-18 DIAGNOSIS — M79671 Pain in right foot: Secondary | ICD-10-CM

## 2016-05-18 DIAGNOSIS — M779 Enthesopathy, unspecified: Secondary | ICD-10-CM

## 2016-05-18 MED ORDER — TRIAMCINOLONE ACETONIDE 10 MG/ML IJ SUSP
10.0000 mg | Freq: Once | INTRAMUSCULAR | Status: AC
  Administered 2016-05-18: 10 mg

## 2016-05-18 NOTE — Progress Notes (Signed)
Subjective:    Patient ID: Janet Tyler, female   DOB: 51 y.o.   MRN: [id number]   HPI patient presents stating that the bunion around the left foot over the right foot becomes painful and makes it difficult to wear shoe gear. Patient states she's been trying to accommodate it and does have a family history of this condition    Review of Systems  All other systems reviewed and are negative.       Objective:  Physical Exam  Constitutional: She is oriented to person, place, and time.  Cardiovascular: Intact distal pulses.   Musculoskeletal: Normal range of motion.  Neurological: She is alert and oriented to person, place, and time.  Skin: Skin is warm.  Nursing note and vitals reviewed.  Neurovascular status intact muscle strength was found to be adequate with patient found to have large hyperostosis with fluid buildup first MPJ left over right with moderate deviation the hallux against second toe. The pain is almost all associated with the bunion deformity and the alignment of the big toe does not seem to cause her problems. 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 This is a difficult problem due to her mental health and other issues she is experiencing and I wanted try to be as conservative as I can with ultimately the possibility for surgery present. I did explain to her that no matter not we will not be able to get full correction of deformity as I would not want her to be nonweightbearing due to her status. Today injected around the left first MPJ 3 Milligan Kenalog 5 g Xylocaine to try to reduce it shrink the inflammatory process and we'll reevaluate again in the next 6 date weeks with advice  on soaks and wide shoe gear  X-rays indicate significant structural bunion deformity left over right with deviation the hallux against the second toe bilateral

## 2016-05-18 NOTE — Progress Notes (Signed)
   Subjective:    Patient ID: Janet Tyler, female    DOB: [birth year], 51 y.o.   MRN: [id number]  HPI Chief Complaint  Patient presents with  . Foot Pain    Bilateral; bunions; swelling in feet; pt stated, "Bunions are hurting feet; has thrown alignment of foot off"; x2-3 yrs      Review of Systems  Constitutional: Positive for fatigue.  Eyes: Positive for visual disturbance.  Respiratory: Positive for shortness of breath.   Cardiovascular: Positive for leg swelling.  Gastrointestinal: Positive for abdominal distention.  Musculoskeletal: Positive for arthralgias, back pain, gait problem and myalgias.  Neurological: Positive for tremors, weakness and numbness.  Psychiatric/Behavioral: Positive for confusion.  All other systems reviewed and are negative.      Objective:   Physical Exam        Assessment & Plan:

## 2016-05-18 NOTE — Patient Instructions (Addendum)
Bunionectomy A bunionectomy is a surgical procedure to remove a bunion. A bunion is a visible bump of bone on the inside of your foot where your big toe meets the rest of your foot. A bunion can develop when pressure turns this bone (first metatarsal) toward the other toes. Shoes that are too tight are the most common cause of bunions. Bunions can also be caused by diseases, such as arthritis and polio. You may need a bunionectomy if your bunion is very large and painful or it affects your ability to walk. Tell a health care provider about:  Any allergies you have.  All medicines you are taking, including vitamins, herbs, eye drops, creams, and over-the-counter medicines.  Any problems you or family members have had with anesthetic medicines.  Any blood disorders you have.  Any surgeries you have had.  Any medical conditions you have. What are the risks? Generally, this is a safe procedure. However, problems may occur, including:  Infection.  Pain.  Nerve damage.  Bleeding or blood clots.  Reactions to medicines.  Numbness, stiffness, or arthritis in your toe.  Foot problems that continue even after the procedure. What happens before the procedure?  Ask your health care provider about:  Changing or stopping your regular medicines. This is especially important if you are taking diabetes medicines or blood thinners.  Taking medicines such as aspirin and ibuprofen. These medicines can thin your blood. Do not take these medicines before your procedure if your health care provider instructs you not to.  Do not drink alcohol before the procedure as directed by your health care provider.  Do not use tobacco products, including cigarettes, chewing tobacco, or electronic cigarettes, before the procedure as directed by your health care provider. If you need help quitting, ask your health care provider.  Ask your health care provider what kind of medicine you will be given during  your procedure. A bunionectomy may be done using one of these:  A medicine that numbs the area (local anesthetic).  A medicine that makes you go to sleep (general anesthetic). If you will be given general anesthetic, do not eat or drink anything after midnight on the night before the procedure or as directed by your health care provider. What happens during the procedure?  An IV tube may be inserted into a vein.  You will be given local anesthetic or general anesthetic.  The surgeon will make a cut (incision) over the enlarged area at the first joint of the big toe. The surgeon will remove the bunion.  You may have more than one incision if any of the bones in your big toe need to be moved. A bone itself may need to be cut.  Sometimes the tissues around the big toe may also need to be cut then tightened or loosened to reposition the toe.  Screws or other hardware may be used to keep your foot in thecorrect position.  The incision will be closed with stitches (sutures) and covered with adhesive strips or another type of bandage (dressing). What happens after the procedure?  You may spend some time in a recovery area.  Your blood pressure, heart rate, breathing rate, and blood oxygen level will be monitored often until the medicines you were given have worn off. This information is not intended to replace advice given to you by your health care provider. Make sure you discuss any questions you have with your health care provider. Document Released: 12/23/2004 Document Revised: 06/17/2015 Document Reviewed: 08/27/2013   Elsevier Interactive Patient Education  2017 [employer]  

## 2016-06-20 NOTE — Progress Notes (Signed)
This encounter was created in error - please disregard.

## 2016-07-13 ENCOUNTER — Ambulatory Visit: Payer: Medicaid Other | Admitting: Podiatry

## 2016-08-02 ENCOUNTER — Encounter: Payer: Self-pay | Admitting: Vascular Surgery

## 2016-08-15 ENCOUNTER — Ambulatory Visit: Payer: Medicaid Other | Admitting: Vascular Surgery

## 2016-09-13 ENCOUNTER — Other Ambulatory Visit: Payer: Self-pay | Admitting: Family Medicine

## 2016-09-13 DIAGNOSIS — K7031 Alcoholic cirrhosis of liver with ascites: Secondary | ICD-10-CM

## 2016-09-20 ENCOUNTER — Ambulatory Visit: Payer: Medicaid Other | Admitting: Podiatry

## 2016-09-28 ENCOUNTER — Ambulatory Visit: Payer: Medicaid Other | Admitting: Podiatry

## 2016-10-03 ENCOUNTER — Ambulatory Visit: Payer: Medicaid Other | Admitting: Vascular Surgery

## 2016-10-18 ENCOUNTER — Other Ambulatory Visit (INDEPENDENT_AMBULATORY_CARE_PROVIDER_SITE_OTHER): Payer: Medicaid Other

## 2016-10-18 ENCOUNTER — Ambulatory Visit (INDEPENDENT_AMBULATORY_CARE_PROVIDER_SITE_OTHER): Payer: Medicaid Other | Admitting: Gastroenterology

## 2016-10-18 ENCOUNTER — Encounter (INDEPENDENT_AMBULATORY_CARE_PROVIDER_SITE_OTHER): Payer: Self-pay

## 2016-10-18 ENCOUNTER — Encounter: Payer: Self-pay | Admitting: Gastroenterology

## 2016-10-18 VITALS — BP 98/60 | HR 80 | Ht 65.0 in | Wt 161.5 lb

## 2016-10-18 DIAGNOSIS — R748 Abnormal levels of other serum enzymes: Secondary | ICD-10-CM

## 2016-10-18 DIAGNOSIS — I8510 Secondary esophageal varices without bleeding: Secondary | ICD-10-CM | POA: Diagnosis not present

## 2016-10-18 DIAGNOSIS — K7031 Alcoholic cirrhosis of liver with ascites: Secondary | ICD-10-CM

## 2016-10-18 DIAGNOSIS — R600 Localized edema: Secondary | ICD-10-CM

## 2016-10-18 LAB — COMPREHENSIVE METABOLIC PANEL
ALT: 18 U/L (ref 0–35)
AST: 28 U/L (ref 0–37)
Albumin: 4.2 g/dL (ref 3.5–5.2)
Alkaline Phosphatase: 117 U/L (ref 39–117)
BUN: 11 mg/dL (ref 6–23)
CHLORIDE: 103 meq/L (ref 96–112)
CO2: 24 meq/L (ref 19–32)
CREATININE: 0.73 mg/dL (ref 0.40–1.20)
Calcium: 9.8 mg/dL (ref 8.4–10.5)
GFR: 89.38 mL/min (ref >60.00)
Glucose, Bld: 87 mg/dL (ref 70–99)
Potassium: 3.7 mEq/L (ref 3.5–5.1)
SODIUM: 135 meq/L (ref 135–145)
Total Bilirubin: 0.9 mg/dL (ref 0.2–1.2)
Total Protein: 7.4 g/dL (ref 6.0–8.3)

## 2016-10-18 LAB — PROTIME-INR
INR: 1.2 ratio — AB (ref 0.8–1.0)
Prothrombin Time: 13.1 s (ref 9.6–13.1)

## 2016-10-18 MED ORDER — SPIRONOLACTONE 25 MG PO TABS
50.0000 mg | ORAL_TABLET | Freq: Every day | ORAL | 3 refills | Status: DC
Start: 2016-10-18 — End: 2017-07-25

## 2016-10-18 MED ORDER — FUROSEMIDE 40 MG PO TABS: 40.0000 mg | ORAL_TABLET | Freq: Every day | ORAL | 3 refills | Status: DC

## 2016-10-18 NOTE — Patient Instructions (Signed)
If you are age 51 or older, your body mass index should be between 23-30. Your Body mass index is 26.88 kg/m. If this is out of the aforementioned range listed, please consider follow up with your Primary Care Provider.  If you are age 62 or younger, your body mass index should be between 19-25. Your Body mass index is 26.88 kg/m. If this is out of the aformentioned range listed, please consider follow up with your Primary Care Provider.   You have been scheduled for an abdominal ultrasound at [facility] (1st floor of hospital) on 10-24-2016 at 9am. Please arrive 15 minutes prior to your appointment for registration. Make certain not to have anything to eat or drink 6 hours prior to your appointment. Should you need to reschedule your appointment, please contact radiology at [phone]. This test typically takes about 30 minutes to perform.  Your physician has requested that you go to the basement for lab work before leaving today.  Thank you for choosing [facility]  Dr Amada Jupiter III

## 2016-10-18 NOTE — Progress Notes (Signed)
[facility] Progress Note  Chief Complaint: Alcoholic cirrhosis  Subjective  History:  Janet Tyler follows up for her alcoholic cirrhosis. She will be sober from alcohol 2 years this November, and generally has been in good health. She continues to have chronic muscle skeletal pain and diffuse cramps. She requests some pain medication, which I again declined. She also has varicose veins and ankle/foot edema for which she requested an increased dose of Lasix. She recently saw the podiatrist for varicose veins. They recommended three-month trial of elastic stockings and, if compliant with that, consideration of other therapy. She has no history of GI bleeding, her appetite is been good and her weight stable.  ROS: Cardiovascular:  no chest pain Respiratory: no dyspnea Remainder systems negative except as above The patient's Past Medical, Family and Social History were reviewed and are on file in the EMR.  Objective:  Med list reviewed  Current Outpatient Prescriptions:  .  albuterol-ipratropium (COMBIVENT) 18-103 MCG/ACT inhaler, Inhale 2 puffs into the lungs every 4 (four) hours as needed for wheezing or shortness of breath. Reported on 04/28/2015, Disp: 1 Inhaler, Rfl: 3 .  DULoxetine (CYMBALTA) 60 MG capsule, Take 1 capsule (60 mg total) by mouth daily., Disp: 30 capsule, Rfl: 5 .  furosemide (LASIX) 40 MG tablet, Take 1 tablet (40 mg total) by mouth daily., Disp: 30 tablet, Rfl: 3 .  LORazepam (ATIVAN PO), Take 1 tablet by mouth daily as needed (anxiety). Reported on 08/12/2015, Disp: , Rfl:  .  meloxicam (MOBIC) 7.5 MG tablet, Take 1 tablet (7.5 mg total) by mouth daily., Disp: 30 tablet, Rfl: 5 .  methocarbamol (ROBAXIN) 500 MG tablet, Take 1 tablet (500 mg total) by mouth every 8 (eight) hours as needed for muscle spasms., Disp: 90 tablet, Rfl: 5 .  Multiple Vitamins-Minerals (MULTIVITAMIN GUMMIES WOMENS PO), Take 2 capsules by mouth once., Disp: , Rfl:  .  OVER THE COUNTER  MEDICATION, Take 2 tablets by mouth every 14 (fourteen) days. , Disp: , Rfl:  .  pantoprazole (PROTONIX) 40 MG tablet, Take 1 tablet (40 mg total) by mouth daily., Disp: 30 tablet, Rfl: 5 .  spironolactone (ALDACTONE) 25 MG tablet, Take 2 tablets (50 mg total) by mouth daily., Disp: 30 tablet, Rfl: 3 .  triamcinolone cream (KENALOG) 0.1 %, APPLY 1 APPLICATION TOPICALLY 2 TIMES DAILY., Disp: 30 g, Rfl: 1   Vital signs in last 24 hrs: Vitals:   10/18/16 1004  BP: 98/60  Pulse: 80    Physical Exam    HEENT: sclera anicteric, oral mucosa moist without lesions  Neck: supple, no thyromegaly, JVD or lymphadenopathy  Cardiac: RRR without murmurs, S1S2 heard,bilateral ankle edema with varicose veins worse on the right  Pulm: clear to auscultation bilaterally, normal RR and effort noted  Abdomen: soft, no tenderness, with active bowel sounds. No guarding or palpable hepatosplenomegaly.  Skin; warm and dry, no jaundice or rash  Not wearing compression stockings today, which she says was only so that I could see the varicose veins.  Radiologic studies:  [place] 3/17: gallstones , no liver mass  AFP 6.0  @ Assessment: Encounter Diagnoses  Name Primary?  . Alcoholic cirrhosis of liver with ascites (HCC) Yes  . Localized edema   . Abnormal transaminases   . Secondary esophageal varices without bleeding (HCC)       Plan:  Does not plan to get flu shot Again declines pneumococcal vaccine Increase aldactone to 50 mother grams once daily, keeps furosemide dose  at 40 minute grams once daily. I have told her I do not intend to increase her diuretic doses any further. Use compression stockings (see podiatry visit) - not clear how compliant she has been. Updated CBC/CMP, INR, AFP [place] to screen for hepatoma See me in 6 months  Total time 30 minutes, over half spent in counseling and coordination of care.   Janet Tyler

## 2016-10-19 LAB — AFP TUMOR MARKER: AFP TUMOR MARKER: 6.4 ng/mL — AB

## 2016-10-24 ENCOUNTER — Ambulatory Visit (HOSPITAL_COMMUNITY): Payer: Medicaid Other

## 2016-10-26 ENCOUNTER — Other Ambulatory Visit: Payer: Self-pay | Admitting: Family Medicine

## 2016-10-26 DIAGNOSIS — K7031 Alcoholic cirrhosis of liver with ascites: Secondary | ICD-10-CM

## 2016-11-01 ENCOUNTER — Ambulatory Visit (HOSPITAL_COMMUNITY): Payer: Medicaid Other

## 2016-11-01 ENCOUNTER — Ambulatory Visit: Payer: Medicaid Other | Admitting: Family Medicine

## 2016-11-07 ENCOUNTER — Ambulatory Visit: Payer: Medicaid Other | Admitting: Vascular Surgery

## 2016-11-08 ENCOUNTER — Ambulatory Visit (HOSPITAL_COMMUNITY)
Admission: RE | Admit: 2016-11-08 | Discharge: 2016-11-08 | Disposition: A | Discharge status: Home / Self Care | Payer: Medicaid Other | Source: Ambulatory Visit | Attending: Gastroenterology | Admitting: Gastroenterology

## 2016-11-08 DIAGNOSIS — R600 Localized edema: Secondary | ICD-10-CM | POA: Diagnosis not present

## 2016-11-08 DIAGNOSIS — I8510 Secondary esophageal varices without bleeding: Secondary | ICD-10-CM | POA: Insufficient documentation

## 2016-11-08 DIAGNOSIS — K7031 Alcoholic cirrhosis of liver with ascites: Secondary | ICD-10-CM | POA: Insufficient documentation

## 2016-11-08 DIAGNOSIS — K8020 Calculus of gallbladder without cholecystitis without obstruction: Secondary | ICD-10-CM | POA: Insufficient documentation

## 2016-11-08 DIAGNOSIS — R748 Abnormal levels of other serum enzymes: Secondary | ICD-10-CM | POA: Insufficient documentation

## 2016-11-09 ENCOUNTER — Encounter: Payer: Self-pay | Admitting: Family Medicine

## 2016-11-09 ENCOUNTER — Ambulatory Visit: Payer: Medicaid Other | Attending: Family Medicine | Admitting: Family Medicine

## 2016-11-09 VITALS — BP 117/79 | HR 96 | Resp 20 | Ht 65.5 in | Wt 161.0 lb

## 2016-11-09 DIAGNOSIS — I8393 Asymptomatic varicose veins of bilateral lower extremities: Secondary | ICD-10-CM | POA: Diagnosis not present

## 2016-11-09 DIAGNOSIS — Z9889 Other specified postprocedural states: Secondary | ICD-10-CM | POA: Diagnosis not present

## 2016-11-09 DIAGNOSIS — K219 Gastro-esophageal reflux disease without esophagitis: Secondary | ICD-10-CM | POA: Insufficient documentation

## 2016-11-09 DIAGNOSIS — J449 Chronic obstructive pulmonary disease, unspecified: Secondary | ICD-10-CM | POA: Diagnosis not present

## 2016-11-09 DIAGNOSIS — Z79899 Other long term (current) drug therapy: Secondary | ICD-10-CM | POA: Diagnosis not present

## 2016-11-09 DIAGNOSIS — I83893 Varicose veins of bilateral lower extremities with other complications: Secondary | ICD-10-CM | POA: Diagnosis not present

## 2016-11-09 DIAGNOSIS — G8929 Other chronic pain: Secondary | ICD-10-CM | POA: Diagnosis not present

## 2016-11-09 DIAGNOSIS — M419 Scoliosis, unspecified: Secondary | ICD-10-CM | POA: Insufficient documentation

## 2016-11-09 DIAGNOSIS — Z791 Long term (current) use of non-steroidal anti-inflammatories (NSAID): Secondary | ICD-10-CM | POA: Diagnosis not present

## 2016-11-09 DIAGNOSIS — M2550 Pain in unspecified joint: Secondary | ICD-10-CM | POA: Insufficient documentation

## 2016-11-09 DIAGNOSIS — F411 Generalized anxiety disorder: Secondary | ICD-10-CM | POA: Diagnosis not present

## 2016-11-09 DIAGNOSIS — F329 Major depressive disorder, single episode, unspecified: Secondary | ICD-10-CM | POA: Insufficient documentation

## 2016-11-09 DIAGNOSIS — L409 Psoriasis, unspecified: Secondary | ICD-10-CM | POA: Insufficient documentation

## 2016-11-09 DIAGNOSIS — K7031 Alcoholic cirrhosis of liver with ascites: Secondary | ICD-10-CM | POA: Diagnosis not present

## 2016-11-09 DIAGNOSIS — W010XXA Fall on same level from slipping, tripping and stumbling without subsequent striking against object, initial encounter: Secondary | ICD-10-CM | POA: Insufficient documentation

## 2016-11-09 DIAGNOSIS — M5441 Lumbago with sciatica, right side: Secondary | ICD-10-CM | POA: Diagnosis not present

## 2016-11-09 MED ORDER — PANTOPRAZOLE SODIUM 40 MG PO TBEC: 40.0000 mg | DELAYED_RELEASE_TABLET | Freq: Every day | ORAL | 5 refills | Status: DC

## 2016-11-09 MED ORDER — BUSPIRONE HCL 7.5 MG PO TABS: 7.5000 mg | ORAL_TABLET | Freq: Two times a day (BID) | ORAL | 3 refills | Status: DC

## 2016-11-09 MED ORDER — DULOXETINE HCL 60 MG PO CPEP: 60.0000 mg | ORAL_CAPSULE | Freq: Every day | ORAL | 5 refills | Status: DC

## 2016-11-09 MED ORDER — TRIAMCINOLONE ACETONIDE 0.1 % EX CREA: TOPICAL_CREAM | CUTANEOUS | 1 refills | Status: AC

## 2016-11-09 MED ORDER — METHOCARBAMOL 500 MG PO TABS: 500.0000 mg | ORAL_TABLET | Freq: Three times a day (TID) | ORAL | 5 refills | Status: DC | PRN

## 2016-11-09 MED ORDER — GABAPENTIN 300 MG PO CAPS: 300.0000 mg | ORAL_CAPSULE | Freq: Two times a day (BID) | ORAL | 3 refills | Status: DC

## 2016-11-09 NOTE — Progress Notes (Signed)
RF on medications: Ativan, Kenalog Discuss ativan  Fall on yesterday left shoulder and chest pain

## 2016-11-09 NOTE — Progress Notes (Signed)
Subjective:  Patient ID: Janet Tyler, female    DOB: 02-Jan-1966  Age: 51 y.o. MRN: [id number]  CC: Cirrhosis  HPI [name] is a 51 year old female with a history of alcoholic liver cirrhosis who comes into the clinic for a follow-up visit.  Her pedal edema has been controlled on current medications and she denies hematemesis, hematochezia, abdominal pain. This coming November she would have been sober from alcohol for 2 years. In 04/2015 she underwent upper endoscopy which revealed grade 1 esophageal varices. Abdominal ultrasound from yesterday revealed cholelithiasis, no sonographic evidence of acute cholecystitis, changes of cirrhosis, no focal hepatic abnormality. Last visit with GI was last month.  She had a visit with vein and vascular specialist due to presence of varicose veins and was encouraged to use compression stockings with consideration for laser therapy if no improvement after 3 months.  She complains of chronic pain in all her joints which she describes as 'hurting everywhere I move" with associated 'creaking'and symptoms are not controlled on Robaxin, meloxicam. She denies swelling of joints, fever, chest pains or shortness of breath. She took a fall yesterday when she tripped over her shoes sustaining injury to her left shoulder and chest wall.  She complains of anxiety; "I get anxiety real bad" and this is treated by anything. She has used Ativan in the past and is wondering if she can receive this. Denies suicidal ideation or intent.  Past Medical History:  Diagnosis Date  . Anxiety   . Arthritis    "Scoliosis"- problem lying on right  side, needs legs elevated.  . Asthma   . Cirrhosis (HCC)    cirrhosis with fluid retention and abdominal swelling. hx. ETOH abuse"  . COPD (chronic obstructive pulmonary disease) (HCC)   . Depression   . GERD (gastroesophageal reflux disease)   . History of palpitations    "heart flutters all over the place"  . Low blood  pressure reading    usual  . Scoliosis   . Substance abuse (HCC)   . Varicose veins of both legs with edema     Past Surgical History:  Procedure Laterality Date  . ESOPHAGOGASTRODUODENOSCOPY (EGD) WITH PROPOFOL N/A 05/18/2015   Procedure: ESOPHAGOGASTRODUODENOSCOPY (EGD) WITH PROPOFOL;  Surgeon: Sherrilyn RistHenry L Danis III, MD;  Location: [facility];  Service: Gastroenterology;  Laterality: N/A;  . TUBAL LIGATION      No Known Allergies  Outpatient Medications Prior to Visit  Medication Sig Dispense Refill  . albuterol-ipratropium (COMBIVENT) 18-103 MCG/ACT inhaler Inhale 2 puffs into the lungs every 4 (four) hours as needed for wheezing or shortness of breath. Reported on 04/28/2015 1 Inhaler 3  . furosemide (LASIX) 40 MG tablet Take 1 tablet (40 mg total) by mouth daily. 30 tablet 3  . meloxicam (MOBIC) 7.5 MG tablet Take 1 tablet (7.5 mg total) by mouth daily. 30 tablet 5  . Multiple Vitamins-Minerals (MULTIVITAMIN GUMMIES WOMENS PO) Take 2 capsules by mouth once.    Marland Kitchen. spironolactone (ALDACTONE) 25 MG tablet Take 2 tablets (50 mg total) by mouth daily. 30 tablet 3  . methocarbamol (ROBAXIN) 500 MG tablet Take 1 tablet (500 mg total) by mouth every 8 (eight) hours as needed for muscle spasms. 90 tablet 5  . pantoprazole (PROTONIX) 40 MG tablet TAKE 1 TABLET BY MOUTH ONCE DAILY 30 tablet 0  . triamcinolone cream (KENALOG) 0.1 % APPLY 1 APPLICATION TOPICALLY 2 TIMES DAILY. 30 g 1  . DULoxetine (CYMBALTA) 60 MG capsule Take 1 capsule (60 mg total)  by mouth daily. (Patient not taking: Reported on 11/09/2016) 30 capsule 5  . LORazepam (ATIVAN PO) Take 1 tablet by mouth daily as needed (anxiety). Reported on 08/12/2015    . OVER THE COUNTER MEDICATION Take 2 tablets by mouth every 14 (fourteen) days.      No facility-administered medications prior to visit.     ROS Review of Systems  Constitutional: Negative for activity change, appetite change and fatigue.  HENT: Negative for congestion, sinus  pressure and sore throat.   Eyes: Negative for visual disturbance.  Respiratory: Negative for cough, chest tightness, shortness of breath and wheezing.   Cardiovascular: Negative for chest pain and palpitations.  Gastrointestinal: Negative for abdominal distention, abdominal pain and constipation.  Endocrine: Negative for polydipsia.  Genitourinary: Negative for dysuria and frequency.  Musculoskeletal:       See hpi  Skin: Negative for rash.  Neurological: Negative for tremors, light-headedness and numbness.  Hematological: Does not bruise/bleed easily.  Psychiatric/Behavioral: Negative for agitation and behavioral problems.       Positive for anxiety    Objective:  BP 117/79 (BP Location: Right Arm, Patient Position: Sitting, Cuff Size: Normal)   Pulse 96   Resp 20   Ht 5' 5.5" (1.664 m)   Wt 161 lb (73 kg)   SpO2 99%   BMI 26.38 kg/m   BP/Weight 11/09/2016 10/18/2016 05/18/2016  Systolic BP 117 98 -  Diastolic BP 79 60 -  Wt. (Lbs) 161 161.5 160  BMI 26.38 26.88 25.82      Physical Exam  Constitutional: She is oriented to person, place, and time. She appears well-developed and well-nourished.  Cardiovascular: Normal rate, normal heart sounds and intact distal pulses.   No murmur heard. Pulmonary/Chest: Effort normal and breath sounds normal. She has no wheezes. She has no rales. She exhibits no tenderness.  Abdominal: Soft. Bowel sounds are normal. She exhibits no distension and no mass. There is no tenderness.  Musculoskeletal: She exhibits edema (1+ pitting bilateral pedal edema) and tenderness (tenderness on range of motion of left shoulder).       Left shoulder: She exhibits decreased range of motion and tenderness.  Neurological: She is alert and oriented to person, place, and time.  Skin:  Scaly rash with silvery surface on lateral aspect of right leg Varicose veins in lower extremities  Psychiatric: She has a normal mood and affect.    CMP Latest Ref Rng &  Units 10/18/2016 03/21/2016 07/15/2015  Glucose 70 - 99 mg/dL 87 89 84  BUN 6 - 23 mg/dL 11 9 8   Creatinine 0.40 - 1.20 mg/dL 1.91 4.78 2.95  Sodium 135 - 145 mEq/L 135 134(L) 135  Potassium 3.5 - 5.1 mEq/L 3.7 3.9 3.9  Chloride 96 - 112 mEq/L 103 101 104  CO2 19 - 32 mEq/L 24 25 28   Calcium 8.4 - 10.5 mg/dL 9.8 9.5 9.4  Total Protein 6.0 - 8.3 g/dL 7.4 7.4 6.9  Total Bilirubin 0.2 - 1.2 mg/dL 0.9 0.9 6.2(Z)  Alkaline Phos 39 - 117 U/L 117 145(H) 135(H)  AST 0 - 37 U/L 28 35 52(H)  ALT 0 - 35 U/L 18 22 25     CBC    Component Value Date/Time   WBC 4.6 03/21/2016 1703   RBC 3.90 03/21/2016 1703   HGB 11.6 (L) 03/21/2016 1703   HCT 34.7 (L) 03/21/2016 1703   PLT 137 (L) 03/21/2016 1703   MCV 89.0 03/21/2016 1703   MCH 29.7 03/21/2016 1703  MCHC 33.4 03/21/2016 1703   RDW 15.2 (H) 03/21/2016 1703   LYMPHSABS 1,748 03/21/2016 1703   MONOABS 644 03/21/2016 1703   EOSABS 138 03/21/2016 1703   BASOSABS 46 03/21/2016 1703    Assessment & Plan:   1. Alcoholic cirrhosis of liver with ascites (HCC) No alcohol consumption for close to 2 years. Continue management with GI - pantoprazole (PROTONIX) 40 MG tablet; Take 1 tablet (40 mg total) by mouth daily.  Dispense: 30 tablet; Refill: 5  2. Arthralgia, unspecified joint Arthralgias in multiple joints Symptoms are suspicious for chronic pain syndrome If uncontrolled on current regimen, we'll send off autoimmune panel and if negative stable refer to pain clinic - DULoxetine (CYMBALTA) 60 MG capsule; Take 1 capsule (60 mg total) by mouth daily.  Dispense: 30 capsule; Refill: 5  3. Chronic bilateral low back pain with right-sided sciatica Advised to apply heat - DULoxetine (CYMBALTA) 60 MG capsule; Take 1 capsule (60 mg total) by mouth daily.  Dispense: 30 capsule; Refill: 5 - methocarbamol (ROBAXIN) 500 MG tablet; Take 1 tablet (500 mg total) by mouth every 8 (eight) hours as needed for muscle spasms.  Dispense: 90 tablet; Refill: 5 -  gabapentin (NEURONTIN) 300 MG capsule; Take 1 capsule (300 mg total) by mouth 2 (two) times daily.  Dispense: 60 capsule; Refill: 3  4. Psoriasis Stable - triamcinolone cream (KENALOG) 0.1 %; APPLY 1 APPLICATION TOPICALLY 2 TIMES DAILY.  Dispense: 30 g; Refill: 1  5. Anxiety state Advised I am unable to refill Ativan Placed on BuSpar - busPIRone (BUSPAR) 7.5 MG tablet; Take 1 tablet (7.5 mg total) by mouth 2 (two) times daily.  Dispense: 60 tablet; Refill: 3  6. Varicose veins Use compression stockings, elevate feet Follow-up with pain and vascular  Meds ordered this encounter  Medications  . pantoprazole (PROTONIX) 40 MG tablet    Sig: Take 1 tablet (40 mg total) by mouth daily.    Dispense:  30 tablet    Refill:  5    Must have office visit for refills  . DULoxetine (CYMBALTA) 60 MG capsule    Sig: Take 1 capsule (60 mg total) by mouth daily.    Dispense:  30 capsule    Refill:  5  . methocarbamol (ROBAXIN) 500 MG tablet    Sig: Take 1 tablet (500 mg total) by mouth every 8 (eight) hours as needed for muscle spasms.    Dispense:  90 tablet    Refill:  5  . gabapentin (NEURONTIN) 300 MG capsule    Sig: Take 1 capsule (300 mg total) by mouth 2 (two) times daily.    Dispense:  60 capsule    Refill:  3  . busPIRone (BUSPAR) 7.5 MG tablet    Sig: Take 1 tablet (7.5 mg total) by mouth 2 (two) times daily.    Dispense:  60 tablet    Refill:  3  . triamcinolone cream (KENALOG) 0.1 %    Sig: APPLY 1 APPLICATION TOPICALLY 2 TIMES DAILY.    Dispense:  30 g    Refill:  1    Follow-up: Return in about 6 weeks (around 12/21/2016) for Complete physical exam.   Jaclyn Shaggy MD

## 2016-11-09 NOTE — Patient Instructions (Signed)

## 2016-11-10 DIAGNOSIS — I83893 Varicose veins of bilateral lower extremities with other complications: Secondary | ICD-10-CM | POA: Insufficient documentation

## 2016-11-14 ENCOUNTER — Telehealth: Payer: Self-pay | Admitting: Gastroenterology

## 2016-11-14 NOTE — Telephone Encounter (Signed)
Patient advised of US results. 

## 2016-11-14 NOTE — Telephone Encounter (Signed)
Patient returning Janet Tyler's call about test results from 10.17.18

## 2016-11-16 ENCOUNTER — Ambulatory Visit: Payer: Medicaid Other | Admitting: Podiatry

## 2017-01-04 ENCOUNTER — Encounter: Payer: Medicaid Other | Admitting: Family Medicine

## 2017-06-05 ENCOUNTER — Telehealth: Payer: Self-pay | Admitting: Gastroenterology

## 2017-06-05 NOTE — Telephone Encounter (Signed)
Have you seen this come through the fax?

## 2017-06-06 NOTE — Telephone Encounter (Signed)
Documentation was completed by Dr Josetta Huddle 06-06-2017 and faxed on 06-05-2017

## 2017-06-22 NOTE — Telephone Encounter (Addendum)
Tory from Oral surgeon office called stating that they never received fax. Please refax to F: [phone] or [phone] P: [phone]

## 2017-06-22 NOTE — Telephone Encounter (Signed)
Information was faxed on 5-14. Per the office they never got the documentation. Paper work has been sent to be scanned and is not uploaded to [employer] yet. Will have the office send a new form to complete.

## 2017-06-26 NOTE — Telephone Encounter (Signed)
Tory calling from Oral surgeon office asking if we can send recent labs and OV notes for clearance. Sent to faX# [phone].

## 2017-06-27 NOTE — Telephone Encounter (Signed)
Spoke to receptionist at [facility] to ask that they refax clearance request as I still do not see any information in our "media" section that he been scanned in. I would just ask Dr Myrtie Neitheranis to fill out information again. Apparently Madison Hickmanory is in surgery so receptionist said she would send clearance request. As of now, we still have not received this. I gave her our fax, [phone].

## 2017-06-28 NOTE — Telephone Encounter (Signed)
Again, I faxed the documentation for the 3rd time

## 2017-07-18 ENCOUNTER — Ambulatory Visit: Payer: Medicaid Other | Admitting: Family Medicine

## 2017-07-25 ENCOUNTER — Ambulatory Visit: Payer: Medicaid Other | Attending: Family Medicine | Admitting: Family Medicine

## 2017-07-25 ENCOUNTER — Encounter: Payer: Self-pay | Admitting: Family Medicine

## 2017-07-25 DIAGNOSIS — J449 Chronic obstructive pulmonary disease, unspecified: Secondary | ICD-10-CM | POA: Insufficient documentation

## 2017-07-25 DIAGNOSIS — K7031 Alcoholic cirrhosis of liver with ascites: Secondary | ICD-10-CM | POA: Insufficient documentation

## 2017-07-25 DIAGNOSIS — Z79899 Other long term (current) drug therapy: Secondary | ICD-10-CM | POA: Diagnosis not present

## 2017-07-25 DIAGNOSIS — F329 Major depressive disorder, single episode, unspecified: Secondary | ICD-10-CM | POA: Diagnosis not present

## 2017-07-25 DIAGNOSIS — M419 Scoliosis, unspecified: Secondary | ICD-10-CM | POA: Diagnosis not present

## 2017-07-25 DIAGNOSIS — Z9114 Patient's other noncompliance with medication regimen: Secondary | ICD-10-CM | POA: Insufficient documentation

## 2017-07-25 DIAGNOSIS — R51 Headache: Secondary | ICD-10-CM | POA: Diagnosis present

## 2017-07-25 DIAGNOSIS — G8929 Other chronic pain: Secondary | ICD-10-CM

## 2017-07-25 DIAGNOSIS — F411 Generalized anxiety disorder: Secondary | ICD-10-CM | POA: Diagnosis not present

## 2017-07-25 DIAGNOSIS — Z791 Long term (current) use of non-steroidal anti-inflammatories (NSAID): Secondary | ICD-10-CM | POA: Insufficient documentation

## 2017-07-25 DIAGNOSIS — M2550 Pain in unspecified joint: Secondary | ICD-10-CM | POA: Insufficient documentation

## 2017-07-25 DIAGNOSIS — M5441 Lumbago with sciatica, right side: Secondary | ICD-10-CM | POA: Diagnosis not present

## 2017-07-25 DIAGNOSIS — K219 Gastro-esophageal reflux disease without esophagitis: Secondary | ICD-10-CM | POA: Diagnosis not present

## 2017-07-25 MED ORDER — DULOXETINE HCL 60 MG PO CPEP: 60.0000 mg | ORAL_CAPSULE | Freq: Every day | ORAL | 5 refills | Status: AC

## 2017-07-25 MED ORDER — GABAPENTIN 300 MG PO CAPS: 300.0000 mg | ORAL_CAPSULE | Freq: Two times a day (BID) | ORAL | 3 refills | Status: AC

## 2017-07-25 MED ORDER — MELOXICAM 7.5 MG PO TABS: 7.5000 mg | ORAL_TABLET | Freq: Every day | ORAL | 5 refills | Status: AC

## 2017-07-25 MED ORDER — BUSPIRONE HCL 7.5 MG PO TABS: 7.5000 mg | ORAL_TABLET | Freq: Two times a day (BID) | ORAL | 3 refills | Status: AC

## 2017-07-25 MED ORDER — PANTOPRAZOLE SODIUM 40 MG PO TBEC: 40.0000 mg | DELAYED_RELEASE_TABLET | Freq: Every day | ORAL | 5 refills | Status: AC

## 2017-07-25 MED ORDER — METHOCARBAMOL 500 MG PO TABS: 500.0000 mg | ORAL_TABLET | Freq: Three times a day (TID) | ORAL | 5 refills | Status: AC | PRN

## 2017-07-25 MED ORDER — SPIRONOLACTONE 25 MG PO TABS: 50.0000 mg | ORAL_TABLET | Freq: Every day | ORAL | 3 refills | Status: AC

## 2017-07-25 NOTE — Progress Notes (Signed)
Subjective:  Patient ID: Janet Tyler, female    DOB: 1965-02-14  Age: 52 y.o. MRN: [id number]  CC: Headache   HPI Janet Tyler is a 52 year old female with a history of alcoholic liver cirrhosis who comes into the clinic for a follow-up visit. She complains of generalized body aches in her joints, her back, her knee, headaches which were uncontrolled on her current pain medications and so she discontinued all of them.  She is wondering if she can be referred to the pain clinic as she states she is unable to focus would like something better for pain. Anxiety has been stable she has been off for quite some time. Denies pedal edema abdominal distention and continues to be sober from alcohol for up to two and a half years.  She has not been taking her medications  Past Medical History:  Diagnosis Date  . Anxiety   . Arthritis    "Scoliosis"- problem lying on right  side, needs legs elevated.  . Asthma   . Cirrhosis ([place])    cirrhosis with fluid retention and abdominal swelling. hx. ETOH abuse"  . COPD (chronic obstructive pulmonary disease) ([place])   . Depression   . GERD (gastroesophageal reflux disease)   . History of palpitations    "heart flutters all over the place"  . Low blood pressure reading    usual  . Scoliosis   . Substance abuse ([place])   . Varicose veins of both legs with edema     Past Surgical History:  Procedure Laterality Date  . ESOPHAGOGASTRODUODENOSCOPY (EGD) WITH PROPOFOL N/A 05/18/2015   Procedure: ESOPHAGOGASTRODUODENOSCOPY (EGD) WITH PROPOFOL;  Surgeon: Doran Stabler, MD;  Location: [facility];  Service: Gastroenterology;  Laterality: N/A;  . TUBAL LIGATION      No Known Allergies   Outpatient Medications Prior to Visit  Medication Sig Dispense Refill  . albuterol-ipratropium (COMBIVENT) 18-103 MCG/ACT inhaler Inhale 2 puffs into the lungs every 4 (four) hours as needed for wheezing or shortness of breath. Reported on 04/28/2015 1 Inhaler 3  .  furosemide (LASIX) 40 MG tablet Take 1 tablet (40 mg total) by mouth daily. 30 tablet 3  . Multiple Vitamins-Minerals (MULTIVITAMIN GUMMIES WOMENS PO) Take 2 capsules by mouth once.    . triamcinolone cream (KENALOG) 0.1 % APPLY 1 APPLICATION TOPICALLY 2 TIMES DAILY. 30 g 1  . gabapentin (NEURONTIN) 300 MG capsule Take 1 capsule (300 mg total) by mouth 2 (two) times daily. 60 capsule 3  . meloxicam (MOBIC) 7.5 MG tablet Take 1 tablet (7.5 mg total) by mouth daily. 30 tablet 5  . methocarbamol (ROBAXIN) 500 MG tablet Take 1 tablet (500 mg total) by mouth every 8 (eight) hours as needed for muscle spasms. 90 tablet 5  . pantoprazole (PROTONIX) 40 MG tablet Take 1 tablet (40 mg total) by mouth daily. 30 tablet 5  . spironolactone (ALDACTONE) 25 MG tablet Take 2 tablets (50 mg total) by mouth daily. 30 tablet 3  . busPIRone (BUSPAR) 7.5 MG tablet Take 1 tablet (7.5 mg total) by mouth 2 (two) times daily. (Patient not taking: Reported on 07/25/2017) 60 tablet 3  . DULoxetine (CYMBALTA) 60 MG capsule Take 1 capsule (60 mg total) by mouth daily. (Patient not taking: Reported on 07/25/2017) 30 capsule 5   No facility-administered medications prior to visit.     ROS Review of Systems  Constitutional: Negative for activity change, appetite change and fatigue.  HENT: Negative for congestion, sinus pressure and sore throat.  Eyes: Negative for visual disturbance.  Respiratory: Negative for cough, chest tightness, shortness of breath and wheezing.   Cardiovascular: Negative for chest pain and palpitations.  Gastrointestinal: Negative for abdominal distention, abdominal pain and constipation.  Endocrine: Negative for polydipsia.  Genitourinary: Negative for dysuria and frequency.  Musculoskeletal: Positive for arthralgias and back pain.  Skin: Negative for rash.  Neurological: Positive for headaches. Negative for tremors, light-headedness and numbness.  Hematological: Does not bruise/bleed easily.    Psychiatric/Behavioral: Negative for agitation and behavioral problems.    Objective:  BP 98/62   Pulse 76   Temp 97.8 F (36.6 C) (Oral)   Ht 5' 5.5" (1.664 m)   Wt 168 lb (76.2 kg)   SpO2 99%   BMI 27.53 kg/m   BP/Weight 07/25/2017 11/09/2016 1/61/0960  Systolic BP 98 454 98  Diastolic BP 62 79 60  Wt. (Lbs) 168 161 161.5  BMI 27.53 26.38 26.88      Physical Exam  Constitutional: She is oriented to person, place, and time. She appears well-developed and well-nourished.  Cardiovascular: Normal rate, normal heart sounds and intact distal pulses.  No murmur heard. Pulmonary/Chest: Effort normal and breath sounds normal. She has no wheezes. She has no rales. She exhibits no tenderness.  Abdominal: Soft. Bowel sounds are normal. She exhibits no distension and no mass. There is no tenderness.  Musculoskeletal: She exhibits tenderness (TTP and ROM of knees, back, ankles).  Neurological: She is alert and oriented to person, place, and time.  Skin: Skin is warm and dry.    CMP Latest Ref Rng & Units 10/18/2016 03/21/2016 07/15/2015  Glucose 70 - 99 mg/dL 87 89 84  BUN 6 - 23 mg/dL 11 9 8   Creatinine 0.40 - 1.20 mg/dL 0.73 0.88 0.60  Sodium 135 - 145 mEq/L 135 134(L) 135  Potassium 3.5 - 5.1 mEq/L 3.7 3.9 3.9  Chloride 96 - 112 mEq/L 103 101 104  CO2 19 - 32 mEq/L 24 25 28   Calcium 8.4 - 10.5 mg/dL 9.8 9.5 9.4  Total Protein 6.0 - 8.3 g/dL 7.4 7.4 6.9  Total Bilirubin 0.2 - 1.2 mg/dL 0.9 0.9 1.6(H)  Alkaline Phos 39 - 117 U/L 117 145(H) 135(H)  AST 0 - 37 U/L 28 35 52(H)  ALT 0 - 35 U/L 18 22 25      Assessment & Plan:   1. Arthralgia, unspecified joint Controlled She discontinued all her medications which have resumed Referred to pain clinic as per request - DULoxetine (CYMBALTA) 60 MG capsule; Take 1 capsule (60 mg total) by mouth daily.  Dispense: 30 capsule; Refill: 5 - meloxicam (MOBIC) 7.5 MG tablet; Take 1 tablet (7.5 mg total) by mouth daily.  Dispense: 30  tablet; Refill: 5 - Ambulatory referral to Pain Clinic  2. Chronic bilateral low back pain with right-sided sciatica - DULoxetine (CYMBALTA) 60 MG capsule; Take 1 capsule (60 mg total) by mouth daily.  Dispense: 30 capsule; Refill: 5 - gabapentin (NEURONTIN) 300 MG capsule; Take 1 capsule (300 mg total) by mouth 2 (two) times daily.  Dispense: 60 capsule; Refill: 3 - methocarbamol (ROBAXIN) 500 MG tablet; Take 1 tablet (500 mg total) by mouth every 8 (eight) hours as needed for muscle spasms.  Dispense: 90 tablet; Refill: 5 - Ambulatory referral to Pain Clinic  3. Alcoholic cirrhosis of liver with ascites (HCC) - CMP14+EGFR - pantoprazole (PROTONIX) 40 MG tablet; Take 1 tablet (40 mg total) by mouth daily.  Dispense: 30 tablet; Refill: 5 - spironolactone (ALDACTONE) 25  MG tablet; Take 2 tablets (50 mg total) by mouth daily.  Dispense: 30 tablet; Refill: 3  4. Anxiety state Stable - busPIRone (BUSPAR) 7.5 MG tablet; Take 1 tablet (7.5 mg total) by mouth 2 (two) times daily.  Dispense: 60 tablet; Refill: 3   Meds ordered this encounter  Medications  . DULoxetine (CYMBALTA) 60 MG capsule    Sig: Take 1 capsule (60 mg total) by mouth daily.    Dispense:  30 capsule    Refill:  5  . gabapentin (NEURONTIN) 300 MG capsule    Sig: Take 1 capsule (300 mg total) by mouth 2 (two) times daily.    Dispense:  60 capsule    Refill:  3  . meloxicam (MOBIC) 7.5 MG tablet    Sig: Take 1 tablet (7.5 mg total) by mouth daily.    Dispense:  30 tablet    Refill:  5  . methocarbamol (ROBAXIN) 500 MG tablet    Sig: Take 1 tablet (500 mg total) by mouth every 8 (eight) hours as needed for muscle spasms.    Dispense:  90 tablet    Refill:  5  . pantoprazole (PROTONIX) 40 MG tablet    Sig: Take 1 tablet (40 mg total) by mouth daily.    Dispense:  30 tablet    Refill:  5  . spironolactone (ALDACTONE) 25 MG tablet    Sig: Take 2 tablets (50 mg total) by mouth daily.    Dispense:  30 tablet    Refill:   3  . busPIRone (BUSPAR) 7.5 MG tablet    Sig: Take 1 tablet (7.5 mg total) by mouth 2 (two) times daily.    Dispense:  60 tablet    Refill:  3    Follow-up: Return in about 3 months (around 10/25/2017) for follow up of chronic medical conditions.   Charlott Rakes MD

## 2017-07-26 LAB — CMP14+EGFR
ALBUMIN: 4.4 g/dL (ref 3.5–5.5)
ALT: 21 IU/L (ref 0–32)
AST: 29 IU/L (ref 0–40)
Albumin/Globulin Ratio: 1.6 (ref 1.2–2.2)
Alkaline Phosphatase: 100 IU/L (ref 39–117)
BILIRUBIN TOTAL: 0.4 mg/dL (ref 0.0–1.2)
BUN / CREAT RATIO: 11 (ref 9–23)
BUN: 8 mg/dL (ref 6–24)
CALCIUM: 9.3 mg/dL (ref 8.7–10.2)
CHLORIDE: 104 mmol/L (ref 96–106)
CO2: 23 mmol/L (ref 20–29)
Creatinine, Ser: 0.76 mg/dL (ref 0.57–1.00)
GFR, EST AFRICAN AMERICAN: 105 mL/min/{1.73_m2} (ref >59)
GFR, EST NON AFRICAN AMERICAN: 91 mL/min/{1.73_m2} (ref >59)
Globulin, Total: 2.7 g/dL (ref 1.5–4.5)
Glucose: 86 mg/dL (ref 65–99)
Potassium: 3.8 mmol/L (ref 3.5–5.2)
Sodium: 140 mmol/L (ref 134–144)
TOTAL PROTEIN: 7.1 g/dL (ref 6.0–8.5)

## 2017-07-27 ENCOUNTER — Telehealth: Payer: Self-pay

## 2017-07-27 NOTE — Telephone Encounter (Signed)
Patient was called and informed of lab results. 

## 2017-08-27 ENCOUNTER — Other Ambulatory Visit (INDEPENDENT_AMBULATORY_CARE_PROVIDER_SITE_OTHER): Payer: Medicaid Other

## 2017-08-27 ENCOUNTER — Encounter (INDEPENDENT_AMBULATORY_CARE_PROVIDER_SITE_OTHER): Payer: Self-pay

## 2017-08-27 ENCOUNTER — Ambulatory Visit (INDEPENDENT_AMBULATORY_CARE_PROVIDER_SITE_OTHER): Payer: Medicaid Other | Admitting: Gastroenterology

## 2017-08-27 ENCOUNTER — Encounter: Payer: Self-pay | Admitting: Gastroenterology

## 2017-08-27 VITALS — BP 98/62 | HR 100 | Ht 64.75 in | Wt 169.2 lb

## 2017-08-27 DIAGNOSIS — K7030 Alcoholic cirrhosis of liver without ascites: Secondary | ICD-10-CM

## 2017-08-27 LAB — CBC WITH DIFFERENTIAL/PLATELET
Basophils Absolute: 0 10*3/uL (ref 0.0–0.1)
Basophils Relative: 0.8 % (ref 0.0–3.0)
EOS ABS: 0.2 10*3/uL (ref 0.0–0.7)
EOS PCT: 3.8 % (ref 0.0–5.0)
HCT: 38.1 % (ref 36.0–46.0)
HEMOGLOBIN: 13.3 g/dL (ref 12.0–15.0)
Lymphocytes Relative: 33.6 % (ref 12.0–46.0)
Lymphs Abs: 1.7 10*3/uL (ref 0.7–4.0)
MCHC: 34.7 g/dL (ref 30.0–36.0)
MCV: 95.4 fl (ref 78.0–100.0)
MONO ABS: 0.7 10*3/uL (ref 0.1–1.0)
Monocytes Relative: 14.1 % — ABNORMAL HIGH (ref 3.0–12.0)
Neutro Abs: 2.4 10*3/uL (ref 1.4–7.7)
Neutrophils Relative %: 47.7 % (ref 43.0–77.0)
Platelets: 121 10*3/uL — ABNORMAL LOW (ref 150.0–400.0)
RBC: 4 Mil/uL (ref 3.87–5.11)
RDW: 14.2 % (ref 11.5–15.5)
WBC: 5.1 10*3/uL (ref 4.0–10.5)

## 2017-08-27 LAB — PROTIME-INR
INR: 1.1 ratio — ABNORMAL HIGH (ref 0.8–1.0)
Prothrombin Time: 12.6 s (ref 9.6–13.1)

## 2017-08-27 NOTE — Progress Notes (Signed)
[facility] Progress Note  Chief Complaint: Alcohol related liver cirrhosis  Subjective  History:  Janet Tyler sees me for the first time since September 2018.  I have followed her for over 2 years for alcohol related cirrhosis that originally had ascites and peripheral edema.  She quit smoking since her last visit, and quit drinking alcohol in November 2016.  Her initial volume overload improved with more time away from alcohol, and at last visit she only had mild peripheral edema.  She has since discontinued diuretics. I have received a request from a local oral surgery practice for records related to her cirrhosis, which were provided to them a few times.  She ultimately had tooth extraction, and says that it went well.    ROS: Janet Tyler complains of multiple musculoskeletal pain complaints as before Chronic anxiety Intermittent cough  Remainder of systems negative except as above  The patient's Past Medical, Family and Social History were reviewed and are on file in the EMR.  Objective:  Med list reviewed  Current Outpatient Medications:  .  albuterol-ipratropium (COMBIVENT) 18-103 MCG/ACT inhaler, Inhale 2 puffs into the lungs every 4 (four) hours as needed for wheezing or shortness of breath. Reported on 04/28/2015, Disp: 1 Inhaler, Rfl: 3 .  busPIRone (BUSPAR) 7.5 MG tablet, Take 1 tablet (7.5 mg total) by mouth 2 (two) times daily., Disp: 60 tablet, Rfl: 3 .  DULoxetine (CYMBALTA) 60 MG capsule, Take 1 capsule (60 mg total) by mouth daily., Disp: 30 capsule, Rfl: 5 .  furosemide (LASIX) 40 MG tablet, Take 1 tablet (40 mg total) by mouth daily., Disp: 30 tablet, Rfl: 3 .  gabapentin (NEURONTIN) 300 MG capsule, Take 1 capsule (300 mg total) by mouth 2 (two) times daily., Disp: 60 capsule, Rfl: 3 .  meloxicam (MOBIC) 7.5 MG tablet, Take 1 tablet (7.5 mg total) by mouth daily., Disp: 30 tablet, Rfl: 5 .  methocarbamol (ROBAXIN) 500 MG tablet, Take 1 tablet (500 mg total) by mouth  every 8 (eight) hours as needed for muscle spasms., Disp: 90 tablet, Rfl: 5 .  Multiple Vitamins-Minerals (MULTIVITAMIN GUMMIES WOMENS PO), Take 2 capsules by mouth once., Disp: , Rfl:  .  pantoprazole (PROTONIX) 40 MG tablet, Take 1 tablet (40 mg total) by mouth daily., Disp: 30 tablet, Rfl: 5 .  spironolactone (ALDACTONE) 25 MG tablet, Take 2 tablets (50 mg total) by mouth daily., Disp: 30 tablet, Rfl: 3 .  triamcinolone cream (KENALOG) 0.1 %, APPLY 1 APPLICATION TOPICALLY 2 TIMES DAILY., Disp: 30 g, Rfl: 1   Vital signs in last 24 hrs: Vitals:   08/27/17 1116  BP: 98/62  Pulse: 100    Physical Exam  She is well-appearing  HEENT: sclera anicteric, oral mucosa moist without lesions  Neck: supple, no thyromegaly, JVD or lymphadenopathy  Cardiac: RRR without murmurs, S1S2 heard,  trace ankle edema bilaterally  Pulm: clear to auscultation bilaterally, normal RR and effort noted  Abdomen: soft, no tenderness, with active bowel sounds. No guarding or palpable hepatosplenomegaly.  No bulging flanks or shifting dullness or  distention  Skin; warm and dry, no jaundice or rash  Recent Labs:  CBC Latest Ref Rng & Units 03/21/2016 07/15/2015 03/10/2015  WBC 3.8 - 10.8 K/uL 4.6 4.8 5.9  Hemoglobin 11.7 - 15.5 g/dL 11.6(L) 11.2(L) 11.4(L)  Hematocrit 35.0 - 45.0 % 34.7(L) 32.2(L) 32.4(L)  Platelets 140 - 400 K/uL 137(L) 103.0(L) 153   CMP Latest Ref Rng & Units 07/25/2017 10/18/2016 03/21/2016  Glucose 65 -  99 mg/dL 86 87 89  BUN 6 - 24 mg/dL 8 11 9   Creatinine 0.57 - 1.00 mg/dL 1.61 0.96 0.45  Sodium 134 - 144 mmol/L 140 135 134(L)  Potassium 3.5 - 5.2 mmol/L 3.8 3.7 3.9  Chloride 96 - 106 mmol/L 104 103 101  CO2 20 - 29 mmol/L 23 24 25   Calcium 8.7 - 10.2 mg/dL 9.3 9.8 9.5  Total Protein 6.0 - 8.5 g/dL 7.1 7.4 7.4  Total Bilirubin 0.0 - 1.2 mg/dL 0.4 0.9 0.9  Alkaline Phos 39 - 117 IU/L 100 117 145(H)  AST 0 - 40 IU/L 29 28 35  ALT 0 - 32 IU/L 21 18 22      Radiologic  studies:  [place] 10/2016 no liver mass  Lab Results  Component Value Date   AFPTM 6.4 (H) 10/18/2016   AFPTM 6.0 04/21/2015   Grade 1 esophageal varices 04/2015   @ASSESSMENTPLANBEGIN @ Assessment: Encounter Diagnosis  Name Primary?  . Alcoholic cirrhosis of liver without ascites (HCC) Yes    She is doing very well these days from the standpoint of her cirrhosis.  She no longer has volume overload requiring diuretics.  She has no history of GI bleeding or encephalopathy.  Grade 1 esophageal varices seen in 2017 did not require beta-blocker primary prophylaxis.  Plan: Update CBC, PT/INR  AFP and right upper quadrant ultrasound to screen for hepatoma. See me in 6 months or sooner as needed  Total time 25 minutes, over half spent face-to-face with patient in counseling and coordination of care.   Charlie Pitter III

## 2017-08-27 NOTE — Patient Instructions (Signed)
If you are age 52 or older, your body mass index should be between 23-30. Your Body mass index is 28.38 kg/m. If this is out of the aforementioned range listed, please consider follow up with your Primary Care Provider.  If you are age 52 or younger, your body mass index should be between 19-25. Your Body mass index is 28.38 kg/m. If this is out of the aformentioned range listed, please consider follow up with your Primary Care Provider.   Your provider has requested that you go to the basement level for lab work before leaving today. Press "B" on the elevator. The lab is located at the first door on the left as you exit the elevator.  [facility] of [facility] on 08-31-2017 at 930am. Please arrive 15 minutes prior to your appointment for registration. Make certain not to have anything to eat or drink 6 hours prior to your appointment. Should you need to reschedule your appointment, please contact radiology at [phone]. This test typically takes about 30 minutes to perform.  It was a pleasure to see you today!  Dr. Myrtie Neitheranis

## 2017-08-28 LAB — AFP TUMOR MARKER: AFP-Tumor Marker: 4.8 ng/mL

## 2017-08-31 ENCOUNTER — Ambulatory Visit (HOSPITAL_COMMUNITY): Payer: Medicaid Other

## 2017-09-05 ENCOUNTER — Ambulatory Visit (HOSPITAL_COMMUNITY)
Admission: RE | Admit: 2017-09-05 | Discharge: 2017-09-05 | Disposition: A | Discharge status: Home / Self Care | Payer: Medicaid Other | Source: Ambulatory Visit | Attending: Gastroenterology | Admitting: Gastroenterology

## 2017-09-05 DIAGNOSIS — K8020 Calculus of gallbladder without cholecystitis without obstruction: Secondary | ICD-10-CM | POA: Diagnosis not present

## 2017-09-05 DIAGNOSIS — K7030 Alcoholic cirrhosis of liver without ascites: Secondary | ICD-10-CM | POA: Insufficient documentation

## 2017-09-17 ENCOUNTER — Telehealth: Payer: Self-pay | Admitting: Gastroenterology

## 2017-09-17 NOTE — Telephone Encounter (Signed)
Called patient back, had to lvm. Let her know that I did call her on 8/16 asked her to return my call. Since I did not hear back from her, sent a letter on 8/20 with results. Left detailed message today with results and recommendations, instructed to call back if needed otherwise she should receive letter in mail.

## 2017-09-17 NOTE — Telephone Encounter (Signed)
Pt called inquiring about imaging results. Pls call her. 

## 2017-10-17 IMAGING — DX DG CHEST 2V
2 series · 2 of 2 positions shown · non-contrast
Comparison: December 05, 2014

CLINICAL DATA: Cough with fever for 3 days

EXAM:
CHEST  2 VIEW

[chest pa]
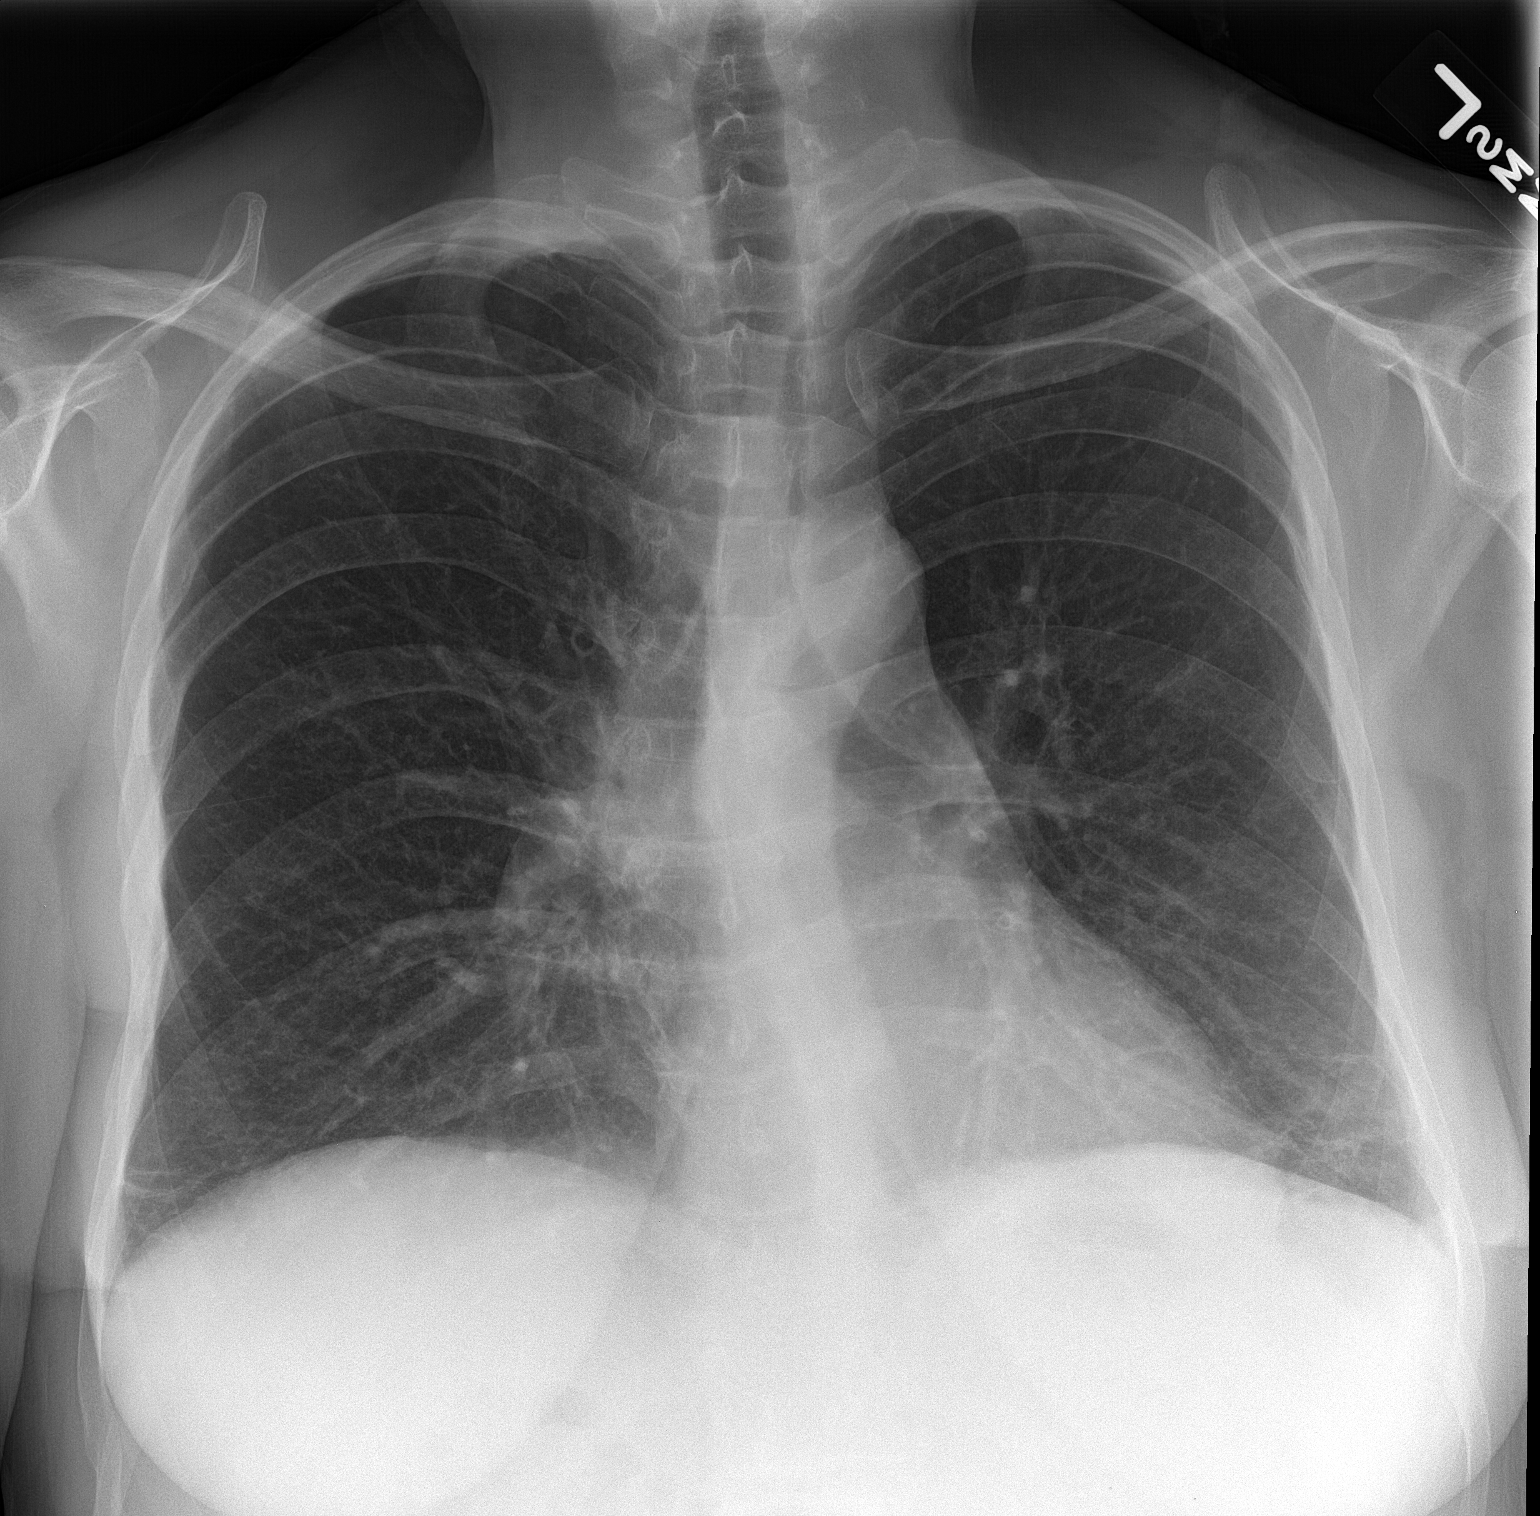

[chest lat]
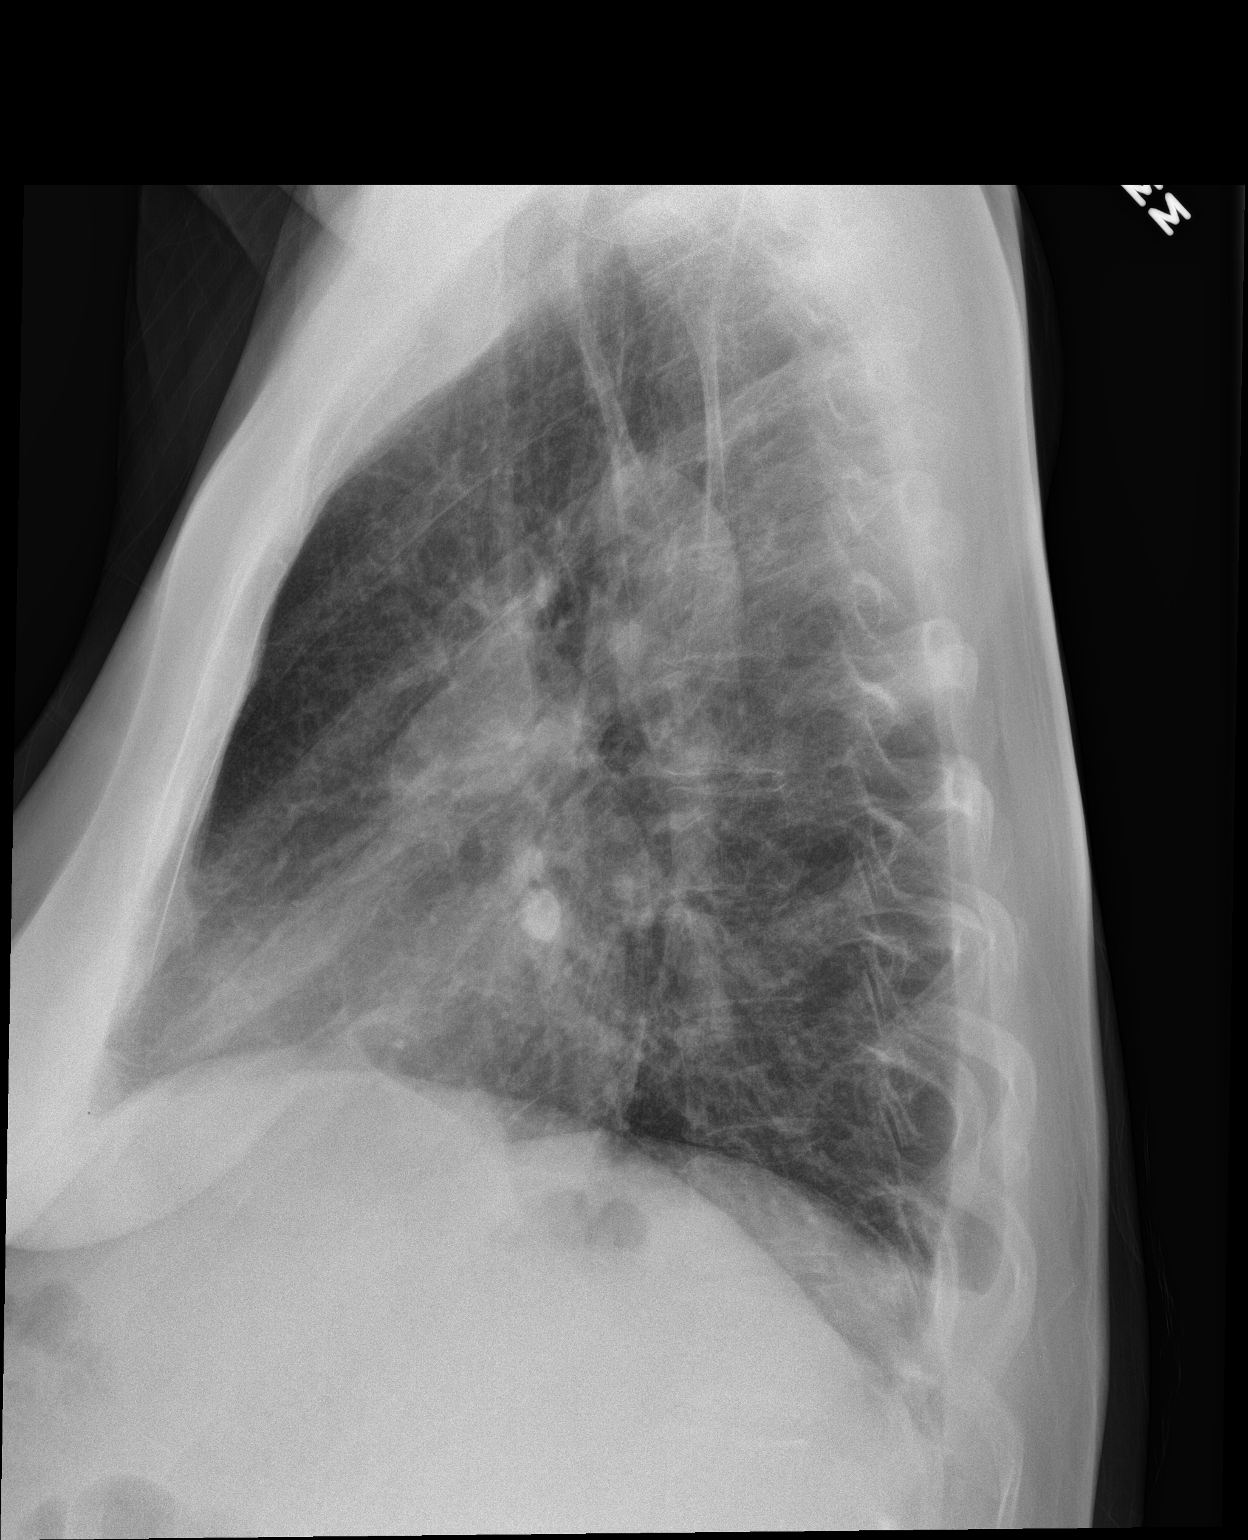

[2 of 2 positions shown; findings below may reference images not displayed]

FINDINGS: There is mild bibasilar scarring. There is no appreciable edema or
consolidation. The heart size and pulmonary vascularity are normal.
No adenopathy. There is evidence of old healed rib fractures on the
right.
IMPRESSION: Mild bibasilar lung scarring.  No edema or consolidation.

## 2017-10-24 ENCOUNTER — Ambulatory Visit: Payer: Medicaid Other | Admitting: Family Medicine

## 2017-12-01 ENCOUNTER — Other Ambulatory Visit: Payer: Self-pay | Admitting: Gastroenterology

## 2017-12-01 DIAGNOSIS — K7031 Alcoholic cirrhosis of liver with ascites: Secondary | ICD-10-CM

## 2017-12-03 NOTE — Telephone Encounter (Signed)
Pt states she did request the refills for the Lasix. She has leg swelling some days worse than others and continues to use it as needed.

## 2017-12-03 NOTE — Telephone Encounter (Signed)
Pharmacy refill request for furosemide.  At last visit in August, she was no longer using it.  Please contact her and find out if she is having more leg swelling, or perhaps if pharmacy just automatically sent renewal.

## 2018-04-23 IMAGING — US US ABDOMEN LIMITED
1 series · 14 of 25 positions shown · non-contrast
Comparison: 04/04/2016

CLINICAL DATA: Cirrhosis, transaminitis

EXAM:
ULTRASOUND ABDOMEN LIMITED RIGHT UPPER QUADRANT

[Series 1: us abdomen limited · 0.20mm/px · 14 of 61 slices shown]
[im 1/61]
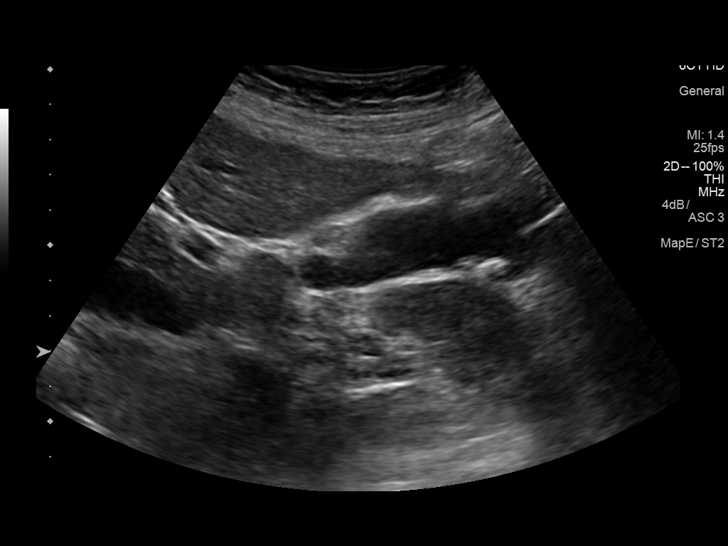
[im 6/61]
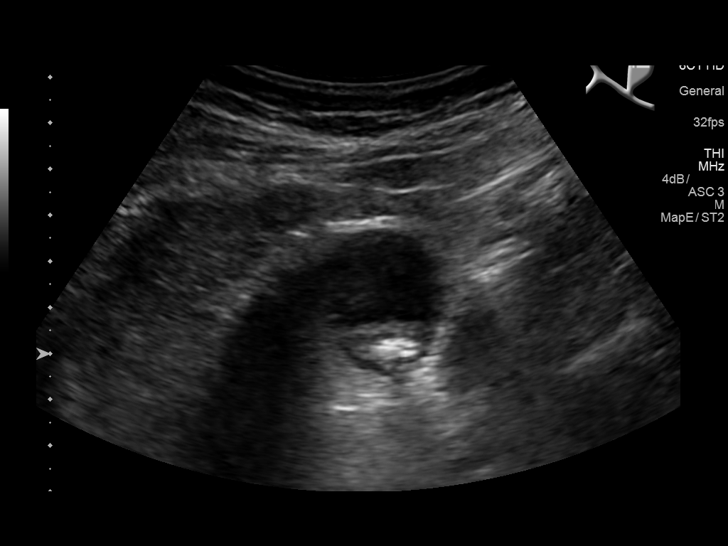
[im 11/61]
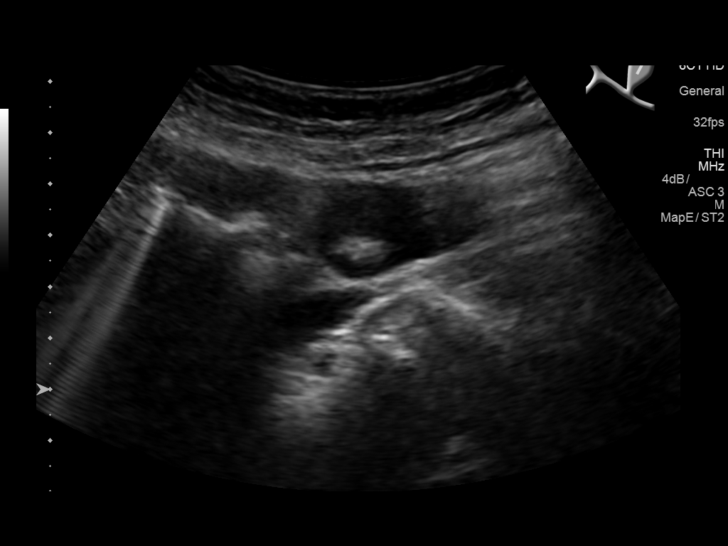
[im 16/61]
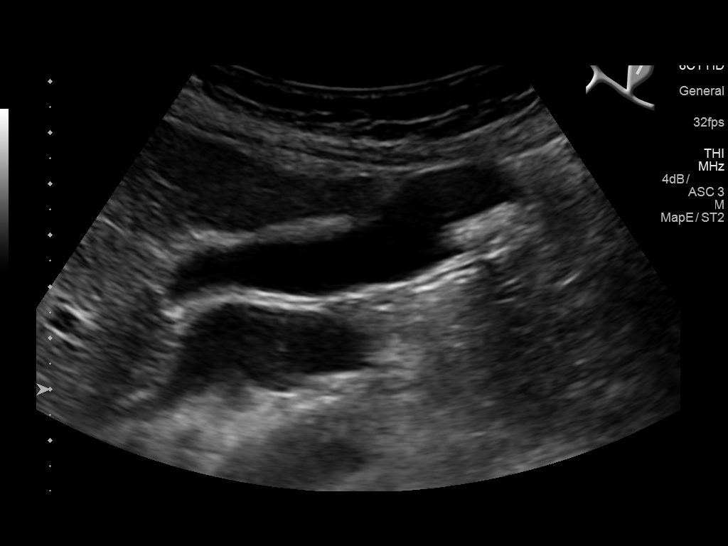
[im 21/61]
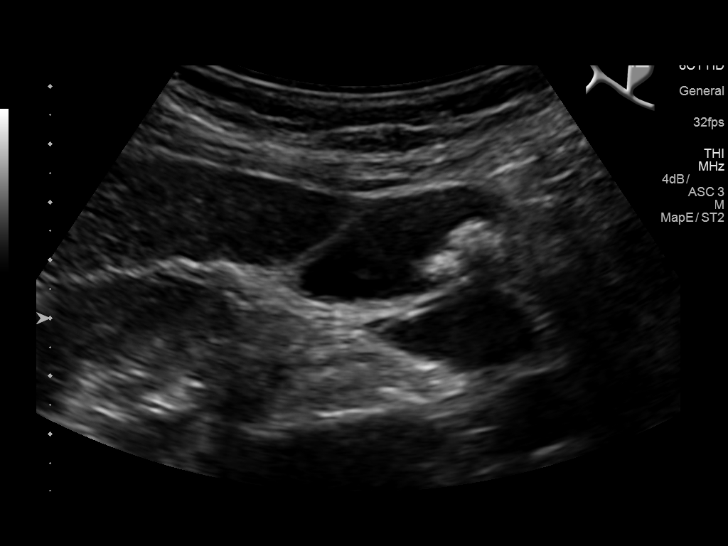
[im 23/61]
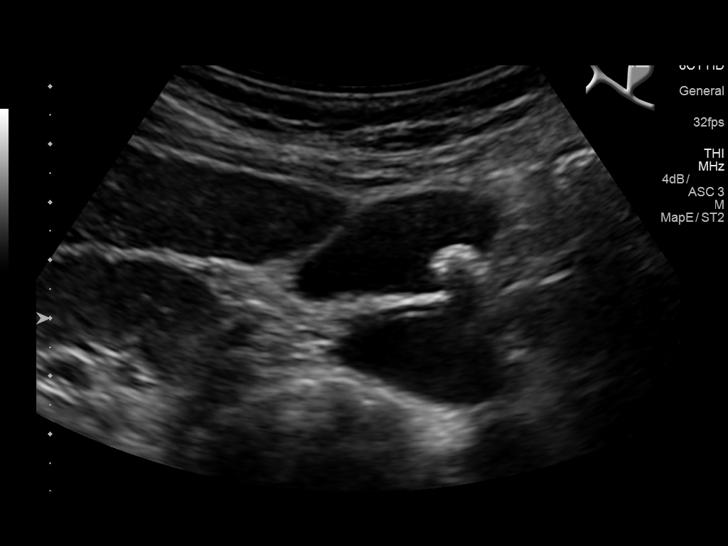
[im 28/61]
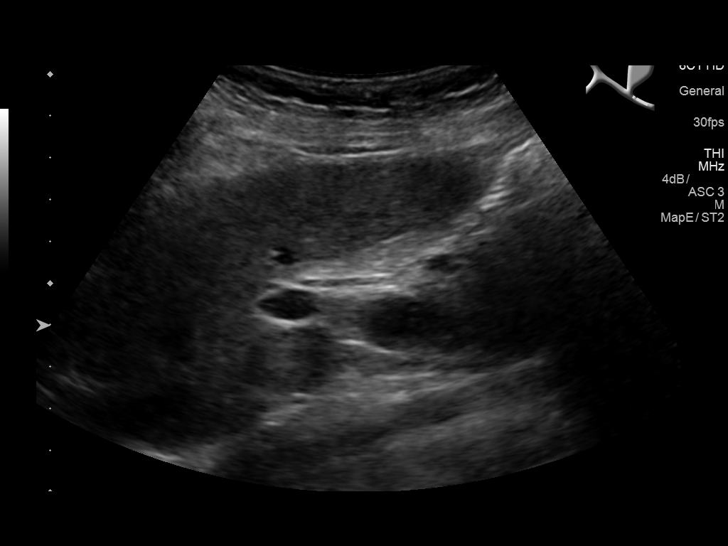
[im 33/61]
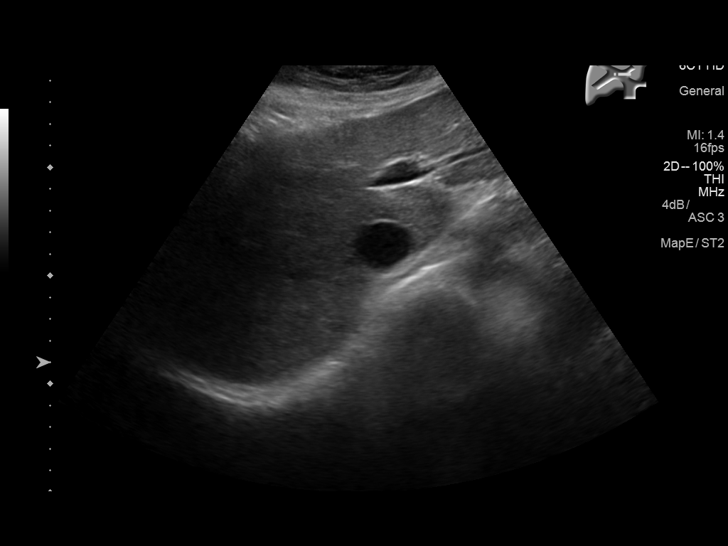
[im 38/61]
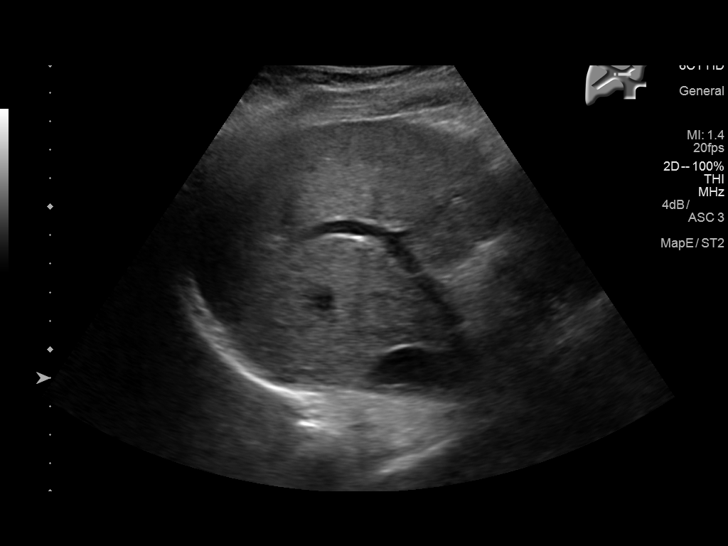
[im 41/61]
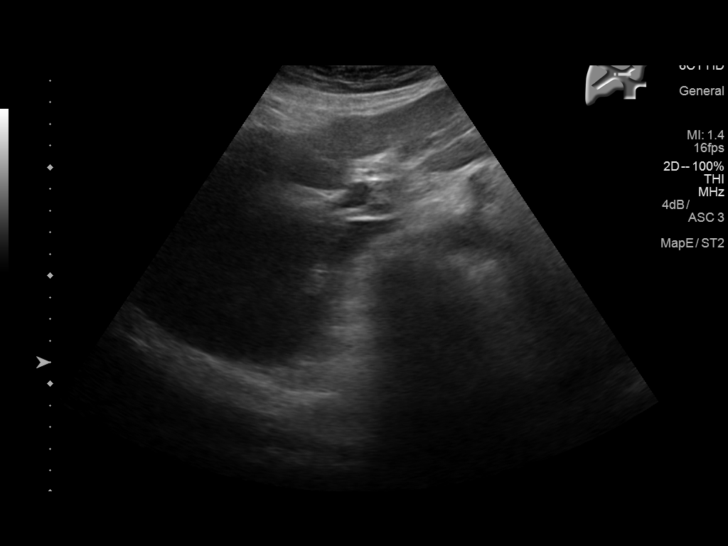
[im 46/61]
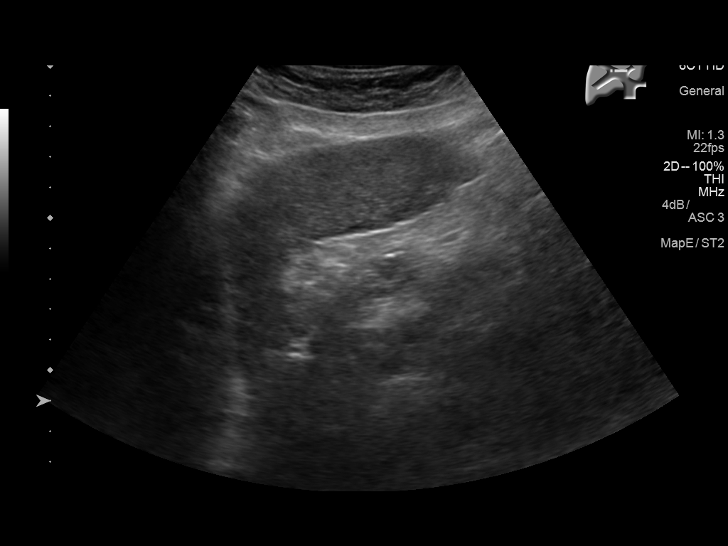
[im 51/61]
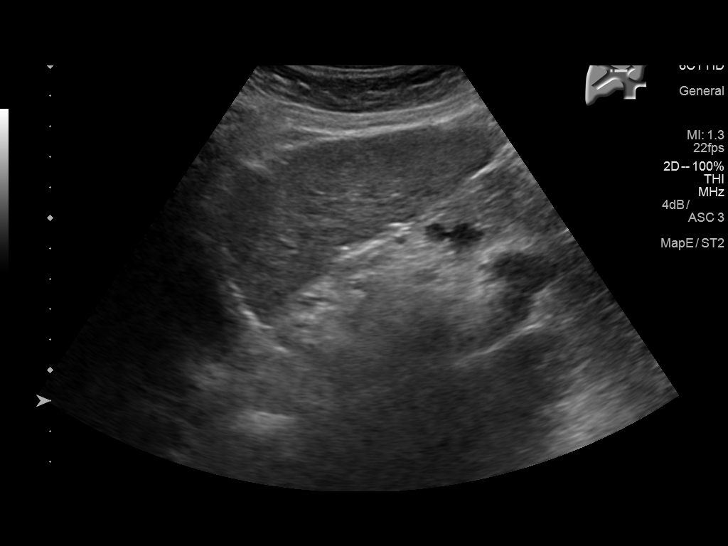
[im 56/61]
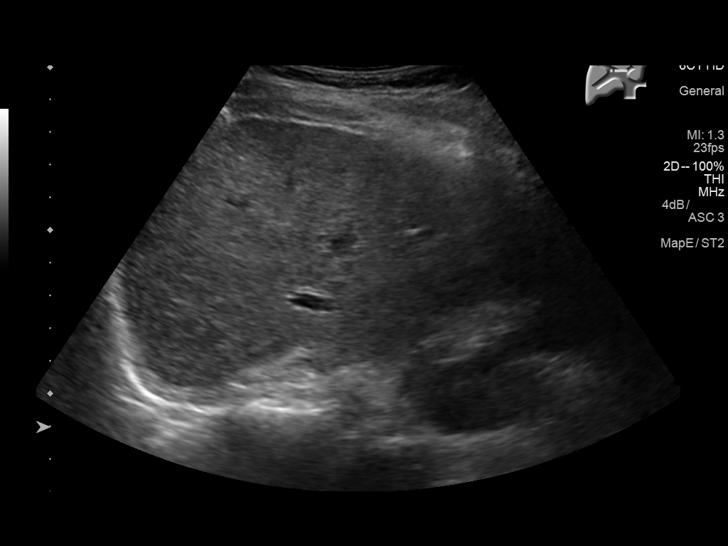
[im 61/61]
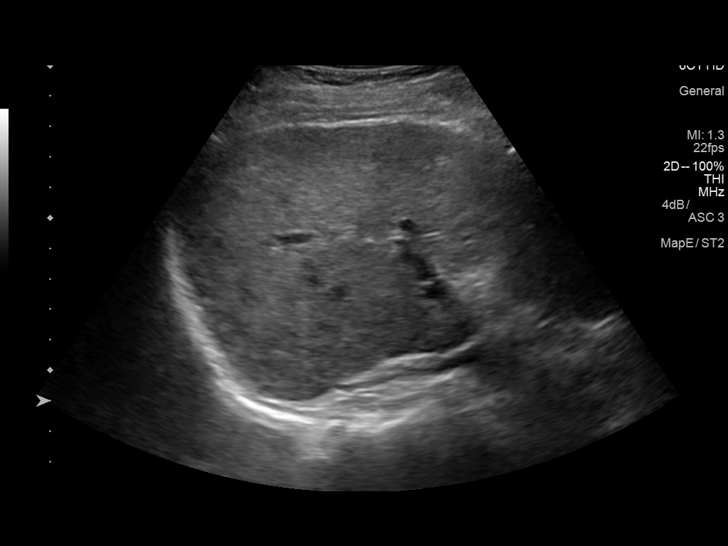

[14 of 25 positions shown; findings below may reference images not displayed]

FINDINGS: Gallbladder:

Gallstones within the gallbladder, the largest 7 mm. No wall
thickening or sonographic Murphy's sign.

Common bile duct:

Diameter: Normal caliber, 2 mm

Liver:

Nodular contours compatible with cirrhosis. No focal abnormality.
Portal vein is patent on color Doppler imaging with normal direction
of blood flow towards the liver.
IMPRESSION: Cholelithiasis.  No sonographic evidence of acute cholecystitis.

Changes of cirrhosis.  No focal hepatic abnormality.

## 2019-02-18 IMAGING — US US ABDOMEN LIMITED
1 series · 14 of 25 positions shown · non-contrast
Comparison: 11/08/2016

CLINICAL DATA: Alcoholic cirrhosis without ascites

EXAM:
ULTRASOUND ABDOMEN LIMITED RIGHT UPPER QUADRANT

[Series 1: us abdomen limited · 0.23mm/px · 14 of 63 slices shown]
[im 1/63]
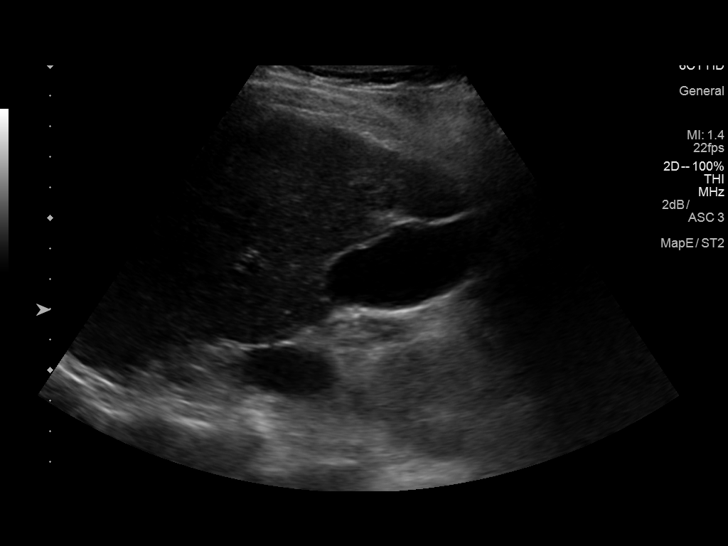
[im 6/63]
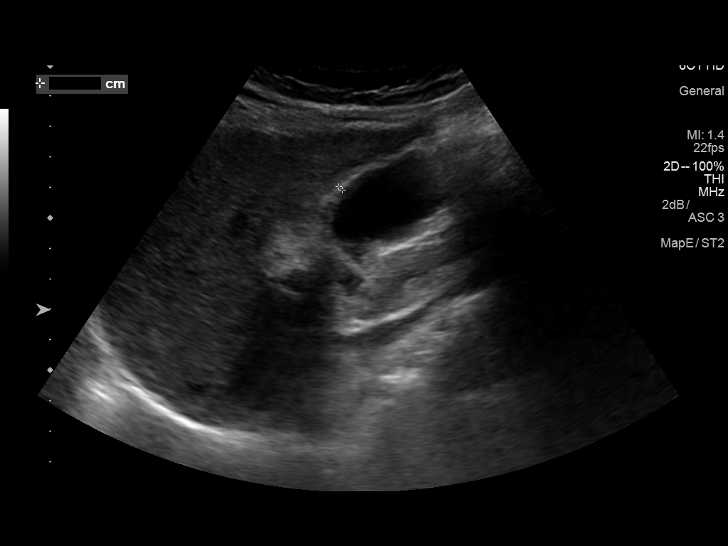
[im 11/63]
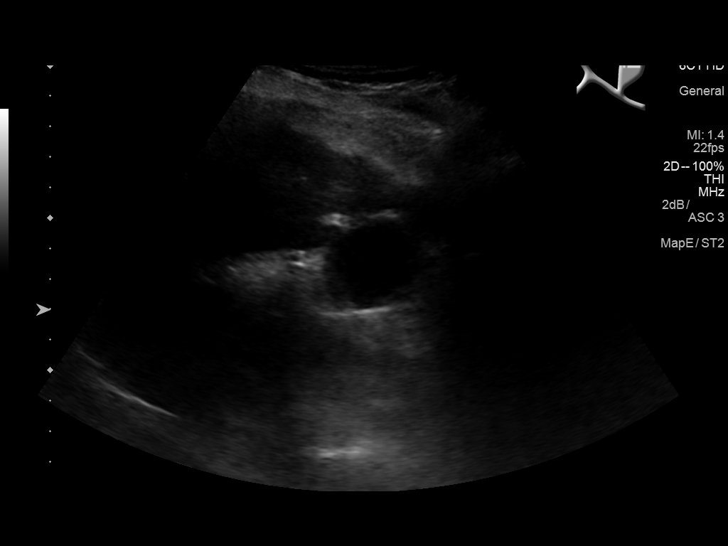
[im 16/63]
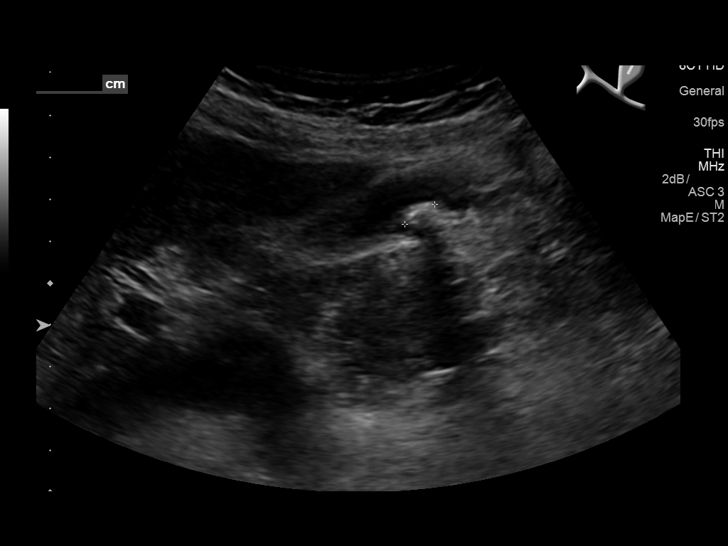
[im 21/63]
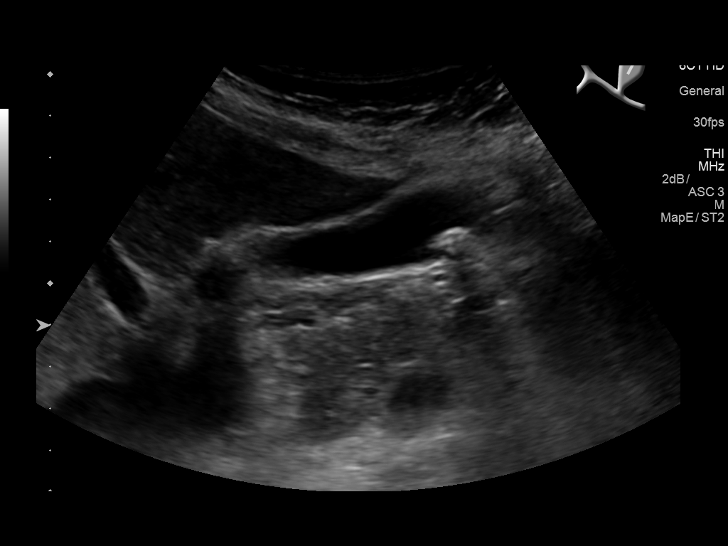
[im 24/63]
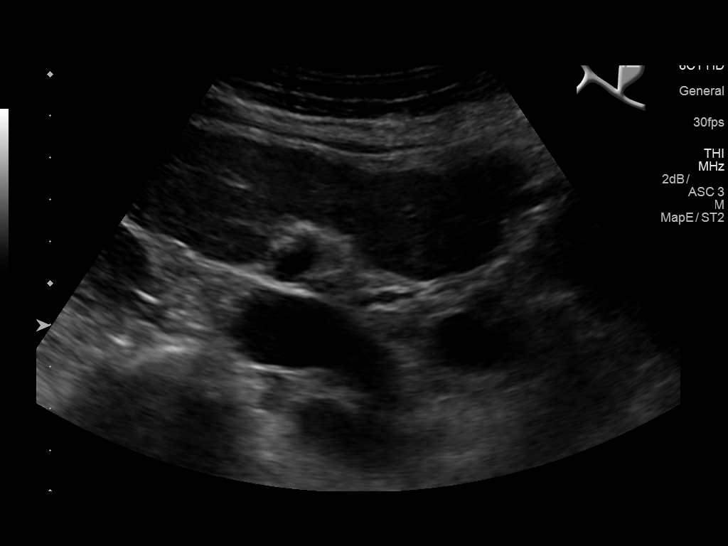
[im 29/63]
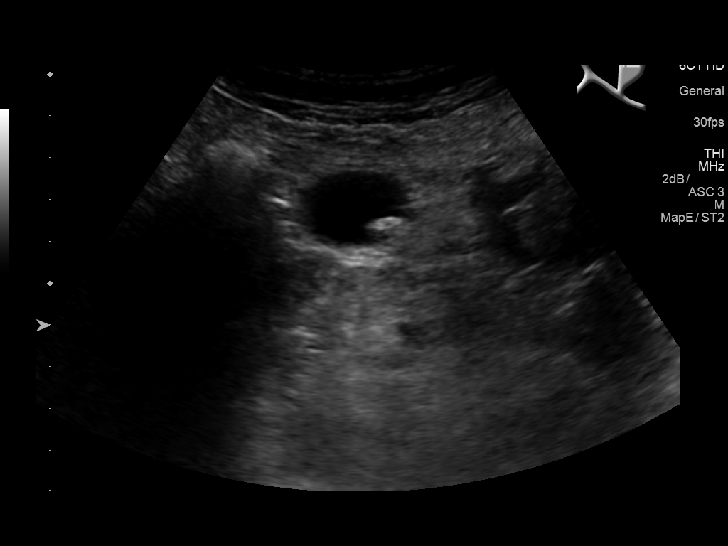
[im 34/63]
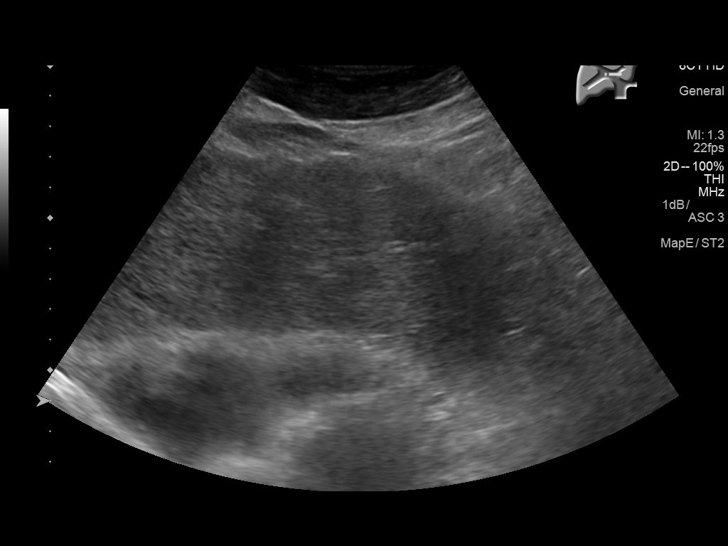
[im 39/63]
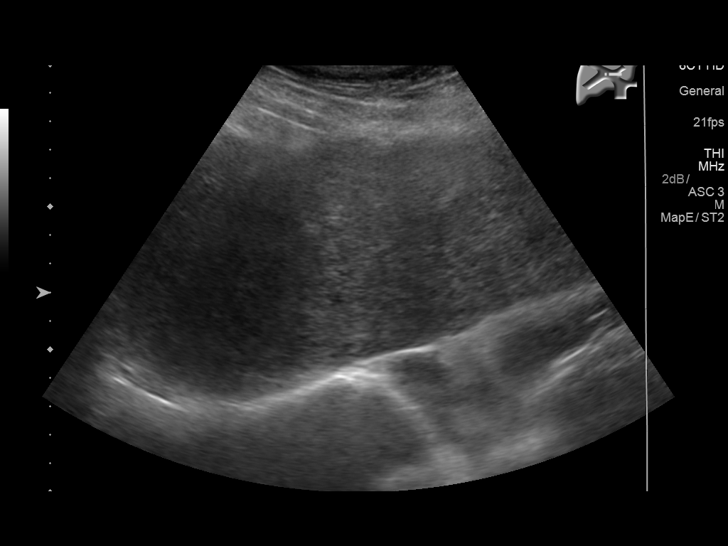
[im 42/63]
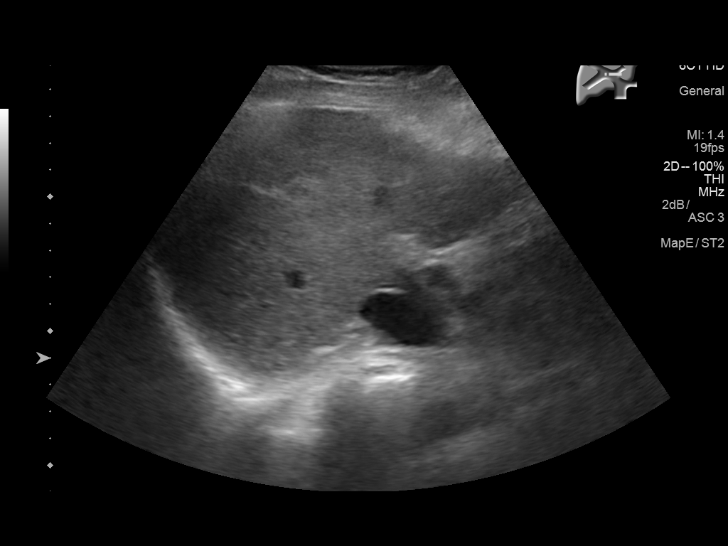
[im 47/63]
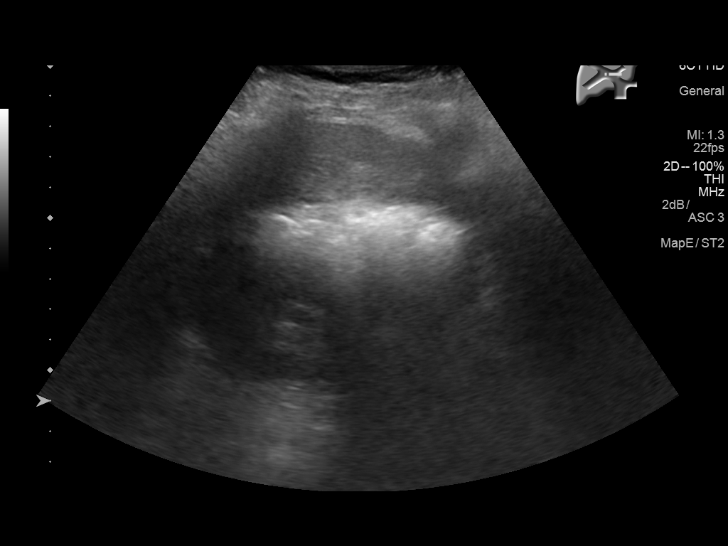
[im 52/63]
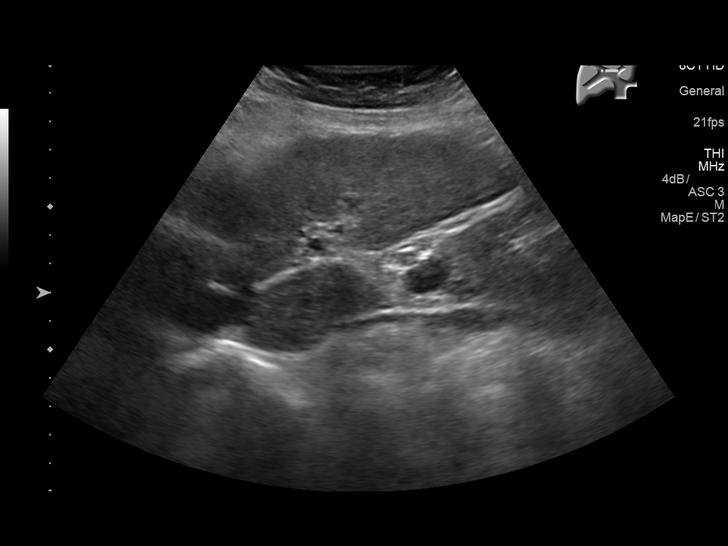
[im 57/63]
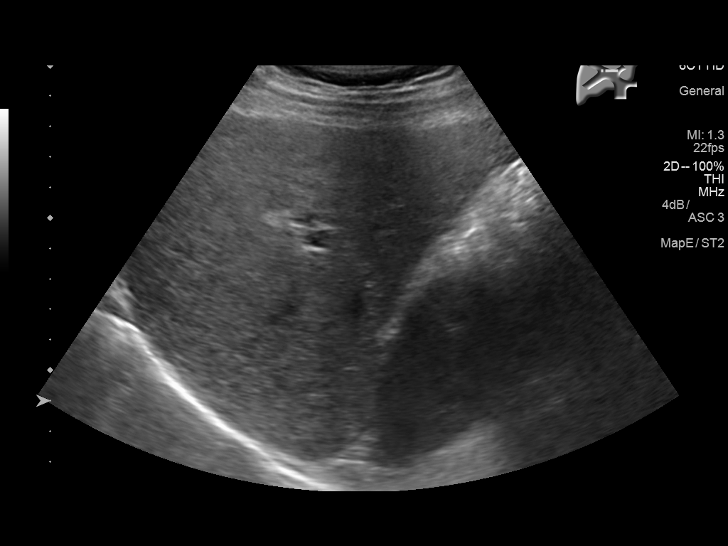
[im 63/63]
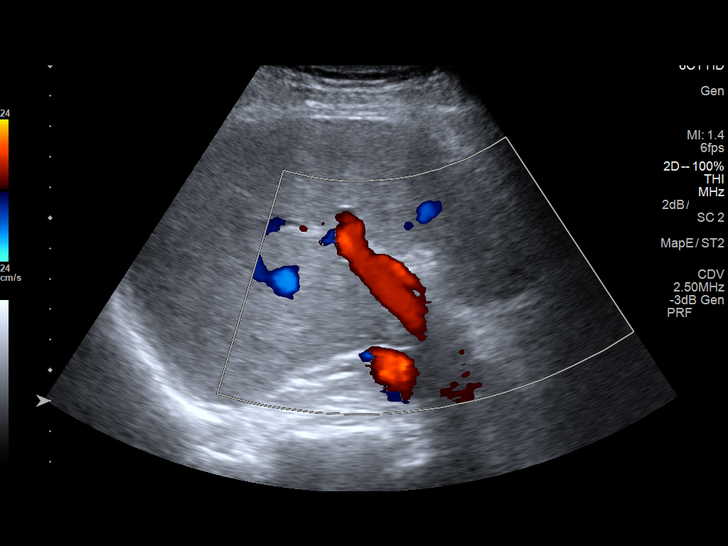

[14 of 25 positions shown; findings below may reference images not displayed]

FINDINGS: Gallbladder:

Cholelithiasis. Largest gallstone 8 mm. Negative sonographic Murphy
sign. No gallbladder wall thickening or signs of acute cholecystitis

Common bile duct:

Diameter: 3.7 mm

Liver:

Diffusely echogenic liver with heterogeneous contour suggesting
cirrhosis. No ascites identified. Portal vein is patent on color
Doppler imaging with normal direction of blood flow towards the
liver.
IMPRESSION: Cholelithiasis without evidence of cholecystitis

Cirrhosis of the liver

## 2020-10-28 ENCOUNTER — Encounter (HOSPITAL_COMMUNITY): Payer: Medicaid Other
